# Patient Record
Sex: Female | Born: 1995 | Race: Black or African American | Hispanic: No | Marital: Single | Smoking: Never smoker
Health system: Southern US, Community
[De-identification: ages and names within clinical notes are randomized; demographics above are authoritative.]

## PROBLEM LIST (undated history)

## (undated) DIAGNOSIS — Z789 Other specified health status: Secondary | ICD-10-CM

## (undated) DIAGNOSIS — A549 Gonococcal infection, unspecified: Secondary | ICD-10-CM

## (undated) HISTORY — PX: NO PAST SURGERIES: SHX2092

---
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
## 2017-01-20 ENCOUNTER — Inpatient Hospital Stay (HOSPITAL_COMMUNITY)
Admission: AD | Admit: 2017-01-20 | Discharge: 2017-01-21 | Disposition: A | Discharge status: Home / Self Care | Payer: Medicaid Other | Source: Ambulatory Visit | Attending: Obstetrics & Gynecology | Admitting: Obstetrics & Gynecology

## 2017-01-20 ENCOUNTER — Encounter (HOSPITAL_COMMUNITY): Payer: Self-pay

## 2017-01-20 DIAGNOSIS — R109 Unspecified abdominal pain: Secondary | ICD-10-CM | POA: Insufficient documentation

## 2017-01-20 DIAGNOSIS — Z113 Encounter for screening for infections with a predominantly sexual mode of transmission: Secondary | ICD-10-CM | POA: Insufficient documentation

## 2017-01-20 HISTORY — DX: Other specified health status: Z78.9

## 2017-01-20 LAB — URINALYSIS, ROUTINE W REFLEX MICROSCOPIC
BILIRUBIN URINE: NEGATIVE
GLUCOSE, UA: NEGATIVE mg/dL
HGB URINE DIPSTICK: NEGATIVE
Ketones, ur: NEGATIVE mg/dL
Leukocytes, UA: NEGATIVE
Nitrite: NEGATIVE
Protein, ur: NEGATIVE mg/dL
SPECIFIC GRAVITY, URINE: 1.027 (ref 1.005–1.030)
pH: 7 (ref 5.0–8.0)

## 2017-01-20 LAB — POCT PREGNANCY, URINE: PREG TEST UR: NEGATIVE

## 2017-01-20 NOTE — MAU Note (Signed)
Pt here for STD testing; has some pain on left lower abdominal area.

## 2017-01-21 ENCOUNTER — Encounter (HOSPITAL_COMMUNITY): Payer: Self-pay | Admitting: *Deleted

## 2017-01-21 DIAGNOSIS — Z113 Encounter for screening for infections with a predominantly sexual mode of transmission: Secondary | ICD-10-CM

## 2017-01-21 LAB — RPR: RPR Ser Ql: NONREACTIVE

## 2017-01-21 LAB — WET PREP, GENITAL
Clue Cells Wet Prep HPF POC: NONE SEEN
SPERM: NONE SEEN
Trich, Wet Prep: NONE SEEN
YEAST WET PREP: NONE SEEN

## 2017-01-21 LAB — HIV ANTIBODY (ROUTINE TESTING W REFLEX): HIV SCREEN 4TH GENERATION: NONREACTIVE

## 2017-01-21 NOTE — Discharge Instructions (Signed)
Health Maintenance, Female Adopting a healthy lifestyle and getting preventive care can go a long way to promote health and wellness. Talk with your health care provider about what schedule of regular examinations is right for you. This is a good chance for you to check in with your provider about disease prevention and staying healthy. In between checkups, there are plenty of things you can do on your own. Experts have done a lot of research about which lifestyle changes and preventive measures are most likely to keep you healthy. Ask your health care provider for more information. Weight and diet Eat a healthy diet  Be sure to include plenty of vegetables, fruits, low-fat dairy products, and lean protein.  Do not eat a lot of foods high in solid fats, added sugars, or salt.  Get regular exercise. This is one of the most important things you can do for your health. ? Most adults should exercise for at least 150 minutes each week. The exercise should increase your heart rate and make you sweat (moderate-intensity exercise). ? Most adults should also do strengthening exercises at least twice a week. This is in addition to the moderate-intensity exercise.  Maintain a healthy weight  Body mass index (BMI) is a measurement that can be used to identify possible weight problems. It estimates body fat based on height and weight. Your health care provider can help determine your BMI and help you achieve or maintain a healthy weight.  For females 20 years of age and older: ? A BMI below 18.5 is considered underweight. ? A BMI of 18.5 to 24.9 is normal. ? A BMI of 25 to 29.9 is considered overweight. ? A BMI of 30 and above is considered obese.  Watch levels of cholesterol and blood lipids  You should start having your blood tested for lipids and cholesterol at 20 years of age, then have this test every 5 years.  You may need to have your cholesterol levels checked more often if: ? Your lipid or  cholesterol levels are high. ? You are older than 21 years of age. ? You are at high risk for heart disease.  Cancer screening Lung Cancer  Lung cancer screening is recommended for adults 55-80 years old who are at high risk for lung cancer because of a history of smoking.  A yearly low-dose CT scan of the lungs is recommended for people who: ? Currently smoke. ? Have quit within the past 15 years. ? Have at least a 30-pack-year history of smoking. A pack year is smoking an average of one pack of cigarettes a day for 1 year.  Yearly screening should continue until it has been 15 years since you quit.  Yearly screening should stop if you develop a health problem that would prevent you from having lung cancer treatment.  Breast Cancer  Practice breast self-awareness. This means understanding how your breasts normally appear and feel.  It also means doing regular breast self-exams. Let your health care provider know about any changes, no matter how small.  If you are in your 20s or 30s, you should have a clinical breast exam (CBE) by a health care provider every 1-3 years as part of a regular health exam.  If you are 40 or older, have a CBE every year. Also consider having a breast X-ray (mammogram) every year.  If you have a family history of breast cancer, talk to your health care provider about genetic screening.  If you are at high risk   for breast cancer, talk to your health care provider about having an MRI and a mammogram every year.  Breast cancer gene (BRCA) assessment is recommended for women who have family members with BRCA-related cancers. BRCA-related cancers include: ? Breast. ? Ovarian. ? Tubal. ? Peritoneal cancers.  Results of the assessment will determine the need for genetic counseling and BRCA1 and BRCA2 testing.  Cervical Cancer Your health care provider may recommend that you be screened regularly for cancer of the pelvic organs (ovaries, uterus, and  vagina). This screening involves a pelvic examination, including checking for microscopic changes to the surface of your cervix (Pap test). You may be encouraged to have this screening done every 3 years, beginning at age 21.  For women ages 30-65, health care providers may recommend pelvic exams and Pap testing every 3 years, or they may recommend the Pap and pelvic exam, combined with testing for human papilloma virus (HPV), every 5 years. Some types of HPV increase your risk of cervical cancer. Testing for HPV may also be done on women of any age with unclear Pap test results.  Other health care providers may not recommend any screening for nonpregnant women who are considered low risk for pelvic cancer and who do not have symptoms. Ask your health care provider if a screening pelvic exam is right for you.  If you have had past treatment for cervical cancer or a condition that could lead to cancer, you need Pap tests and screening for cancer for at least 20 years after your treatment. If Pap tests have been discontinued, your risk factors (such as having a new sexual partner) need to be reassessed to determine if screening should resume. Some women have medical problems that increase the chance of getting cervical cancer. In these cases, your health care provider may recommend more frequent screening and Pap tests.  Colorectal Cancer  This type of cancer can be detected and often prevented.  Routine colorectal cancer screening usually begins at 21 years of age and continues through 21 years of age.  Your health care provider may recommend screening at an earlier age if you have risk factors for colon cancer.  Your health care provider may also recommend using home test kits to check for hidden blood in the stool.  A small camera at the end of a tube can be used to examine your colon directly (sigmoidoscopy or colonoscopy). This is done to check for the earliest forms of colorectal  cancer.  Routine screening usually begins at age 50.  Direct examination of the colon should be repeated every 5-10 years through 21 years of age. However, you may need to be screened more often if early forms of precancerous polyps or small growths are found.  Skin Cancer  Check your skin from head to toe regularly.  Tell your health care provider about any new moles or changes in moles, especially if there is a change in a mole's shape or color.  Also tell your health care provider if you have a mole that is larger than the size of a pencil eraser.  Always use sunscreen. Apply sunscreen liberally and repeatedly throughout the day.  Protect yourself by wearing long sleeves, pants, a wide-brimmed hat, and sunglasses whenever you are outside.  Heart disease, diabetes, and high blood pressure  High blood pressure causes heart disease and increases the risk of stroke. High blood pressure is more likely to develop in: ? People who have blood pressure in the high end of   the normal range (130-139/85-89 mm Hg). ? People who are overweight or obese. ? People who are African American.  If you are 79-69 years of age, have your blood pressure checked every 3-5 years. If you are 50 years of age or older, have your blood pressure checked every year. You should have your blood pressure measured twice--once when you are at a hospital or clinic, and once when you are not at a hospital or clinic. Record the average of the two measurements. To check your blood pressure when you are not at a hospital or clinic, you can use: ? An automated blood pressure machine at a pharmacy. ? A home blood pressure monitor.  If you are between 77 years and 74 years old, ask your health care provider if you should take aspirin to prevent strokes.  Have regular diabetes screenings. This involves taking a blood sample to check your fasting blood sugar level. ? If you are at a normal weight and have a low risk for  diabetes, have this test once every three years after 21 years of age. ? If you are overweight and have a high risk for diabetes, consider being tested at a younger age or more often. Preventing infection Hepatitis B  If you have a higher risk for hepatitis B, you should be screened for this virus. You are considered at high risk for hepatitis B if: ? You were born in a country where hepatitis B is common. Ask your health care provider which countries are considered high risk. ? Your parents were born in a high-risk country, and you have not been immunized against hepatitis B (hepatitis B vaccine). ? You have HIV or AIDS. ? You use needles to inject street drugs. ? You live with someone who has hepatitis B. ? You have had sex with someone who has hepatitis B. ? You get hemodialysis treatment. ? You take certain medicines for conditions, including cancer, organ transplantation, and autoimmune conditions.  Hepatitis C  Blood testing is recommended for: ? Everyone born from [birth year] through 1965. ? Anyone with known risk factors for hepatitis C.  Sexually transmitted infections (STIs)  You should be screened for sexually transmitted infections (STIs) including gonorrhea and chlamydia if: ? You are sexually active and are younger than 21 years of age. ? You are older than 21 years of age and your health care provider tells you that you are at risk for this type of infection. ? Your sexual activity has changed since you were last screened and you are at an increased risk for chlamydia or gonorrhea. Ask your health care provider if you are at risk.  If you do not have HIV, but are at risk, it may be recommended that you take a prescription medicine daily to prevent HIV infection. This is called pre-exposure prophylaxis (PrEP). You are considered at risk if: ? You are sexually active and do not regularly use condoms or know the HIV status of your partner(s). ? You take drugs by injection. ? You  are sexually active with a partner who has HIV.  Talk with your health care provider about whether you are at high risk of being infected with HIV. If you choose to begin PrEP, you should first be tested for HIV. You should then be tested every 3 months for as long as you are taking PrEP. Pregnancy  If you are premenopausal and you may become pregnant, ask your health care provider about preconception counseling.  If you may become  pregnant, take 400 to 800 micrograms (mcg) of folic acid every day.  If you want to prevent pregnancy, talk to your health care provider about birth control (contraception). Osteoporosis and menopause  Osteoporosis is a disease in which the bones lose minerals and strength with aging. This can result in serious bone fractures. Your risk for osteoporosis can be identified using a bone density scan.  If you are 20 years of age or older, or if you are at risk for osteoporosis and fractures, ask your health care provider if you should be screened.  Ask your health care provider whether you should take a calcium or vitamin D supplement to lower your risk for osteoporosis.  Menopause may have certain physical symptoms and risks.  Hormone replacement therapy may reduce some of these symptoms and risks. Talk to your health care provider about whether hormone replacement therapy is right for you. Follow these instructions at home:  Schedule regular health, dental, and eye exams.  Stay current with your immunizations.  Do not use any tobacco products including cigarettes, chewing tobacco, or electronic cigarettes.  If you are pregnant, do not drink alcohol.  If you are breastfeeding, limit how much and how often you drink alcohol.  Limit alcohol intake to no more than 1 drink per day for nonpregnant women. One drink equals 12 ounces of beer, 5 ounces of wine, or 1 ounces of hard liquor.  Do not use street drugs.  Do not share needles.  Ask your health care  provider for help if you need support or information about quitting drugs.  Tell your health care provider if you often feel depressed.  Tell your health care provider if you have ever been abused or do not feel safe at home. This information is not intended to replace advice given to you by your health care provider. Make sure you discuss any questions you have with your health care provider. Document Released: 01/15/2011 Document Revised: 12/08/2015 Document Reviewed: 04/05/2015 Elsevier Interactive Patient Education  2018 [employer].   Pregnancy and Sexually Transmitted Infections An STI (sexually transmitted infection) is a disease or infection that may be passed (transmitted) from person to person, usually during sexual activity. This may happen by way of saliva, semen, blood, vaginal mucus, or urine. An STI can be caused by bacteria, viruses, or parasites. During pregnancy, STIs can be dangerous for you and your unborn baby. It is important to take steps to reduce your chances of getting an STI. Also, you need to be seen by your health care provider right away if you think you may have an STI, or if you think you may have been exposed to an STI. Diagnosis and treatment will depend on the type of STI. If you are already pregnant, you will be screened for HIV (human immunodeficiency virus) early in your pregnancy. If you are at high risk for HIV, this test may be repeated during your third trimester of pregnancy. What are some common STIs? There are different types of STIs. Some STIs that cause problems in pregnancy include:  Gonorrhea.  Chlamydia.  Syphilis.  HIV and AIDS (acquired immunodeficiency syndrome).  Genital herpes.  Hepatitis.  Genital warts.  Human papillomavirus (HPV).  Trichomoniasis.  STIs that do not affect the baby include:  Chancroid.  Pubic lice.  What are the possible effects of STIs during pregnancy? STIs can  cause:  Stillbirth.  Miscarriage.  Premature labor.  Premature rupture of the membranes.  Serious birth defects or deformities.  Infection of the amniotic sac.  Infections that occur after birth (postpartum) in you and the baby.  Slowed growth of the baby before birth.  Illnesses in newborns.  What are common symptoms of STIs? Different STIs have different symptoms. Some women may not have any symptoms. If symptoms are present, they may include:  Painful or bloody urination.  Pain in the pelvis, abdomen, vagina, anus, throat, or eyes.  A skin rash, itching, or irritation.  Growths, ulcerations, blisters, or sores in the genital and anal areas.  Fever.  Abnormal vaginal discharge, with or without bad odor.  Pain or bleeding during sexual intercourse.  Yellowing of the skin and the white parts of the eyes (jaundice).  Swollen glands in the groin area.  Even if symptoms are not present, an STI can still be passed to another person during sexual contact. How are STIs diagnosed? Your health care provider can use tests to determine if you have an STI. These may include blood tests, urine tests, and tests performed during a pelvic exam. You should be screened for STIs, including gonorrhea and chlamydia, if:  You are sexually active and are younger than age 78.  You are age 89 or older and your health care provider tells you that you are at risk for these types of infections.  Your sexual activity has changed since the last time you were screened, and you are at an increased risk for chlamydia or gonorrhea. Ask your health care provider if you are at risk.  How can I reduce my risk of getting an STI? Take these actions to reduce your risk of getting an STI:  Do not have any oral, vaginal, or anal sex. This is known as practicing abstinence.  If you have sex, use a latex condom or a female condom consistently and correctly during sexual intercourse.  Use dental dams  and water-soluble lubricants during sexual activity. Do not use petroleum jelly or oils.  Avoid having multiple sexual partners.  Do not have sex with someone who has other sexual partners.  Do not have sex with anyone you do not know or who is at high risk for an STI.  Avoid risky sex acts that can break the skin.  Do not have sex if you have open sores on your mouth or skin.  Avoid engaging in oral and anal sex acts.  Get the hepatitis vaccine. It is safe for pregnant women.  What should I do if I think I have an STI?  See your health care provider.  Tell your sexual partner or partners. They should be tested and treated for any STIs.  Do not have sex until your health care provider says it is okay. Get help right away if: Contact your health care provider right away if:  You have any symptoms of an STI.  You think that you have, or your sexual partner has, an STI even if there are no symptoms.  You think that you may have been exposed to an STI.  This information is not intended to replace advice given to you by your health care provider. Make sure you discuss any questions you have with your health care provider. Document Released: 08/09/2004 Document Revised: 03/01/2016 Document Reviewed: 02/06/2016 Elsevier Interactive Patient Education  [employer].

## 2017-01-21 NOTE — MAU Provider Note (Signed)
History     CSN: [id number]  Arrival date and time: 01/20/17 2258   First Provider Initiated Contact with Patient 01/20/17 2358      Chief Complaint  Patient presents with  . Abdominal Pain    right side   HPI Rebecca Church is a 21 y.o. non pregnant female who presents requesting STD testing. She states "I just want to be checked for everything." She denies any known exposure to STDs. She denies any pain, vaginal bleeding or abnormal discharge. She receives Depo for birth control and has irregular bleeding. LMP sometime last month.   OB History    No data available      Past Medical History:  Diagnosis Date  . Medical history non-contributory     Past Surgical History:  Procedure Laterality Date  . NO PAST SURGERIES      History reviewed. No pertinent family history.  Social History  Substance Use Topics  . Smoking status: Never Smoker  . Smokeless tobacco: Never Used  . Alcohol use No     Comment: occasionally    Allergies: No Known Allergies  Prescriptions Prior to Admission  Medication Sig Dispense Refill Last Dose  . azithromycin (ZITHROMAX) 500 MG tablet Take 2 tablets (1,000 mg total) by mouth daily. 2 tablet 0 More than a month at Unknown time  . cephALEXin (KEFLEX) 500 MG capsule Take 1 capsule (500 mg total) by mouth 3 (three) times daily. 30 capsule 0 More than a month at Unknown time  . fluconazole (DIFLUCAN) 150 MG tablet Take 1 tablet (150 mg total) by mouth once. 1 tablet 0 More than a month at Unknown time  . metroNIDAZOLE (FLAGYL) 500 MG tablet Take 1 tablet (500 mg total) by mouth 2 (two) times daily. 14 tablet 0 More than a month at Unknown time    Review of Systems  Constitutional: Negative.  Negative for chills and fever.  HENT: Negative.   Respiratory: Negative.  Negative for shortness of breath.   Cardiovascular: Negative.  Negative for chest pain.  Gastrointestinal: Negative.  Negative for abdominal pain, constipation, diarrhea, nausea  and vomiting.  Genitourinary: Negative.  Negative for dysuria, vaginal bleeding and vaginal discharge.  Neurological: Negative.  Negative for dizziness and headaches.  Psychiatric/Behavioral: Negative.    Physical Exam   Blood pressure 106/68, pulse 91, temperature 98.7 F (37.1 C), temperature source Oral, height 5\' 11"  (1.803 m), weight 179 lb (81.2 kg), SpO2 99 %.  Physical Exam  Nursing note and vitals reviewed. Constitutional: She appears well-developed and well-nourished.  HENT:  Head: Normocephalic and atraumatic.  Eyes: Conjunctivae are normal. No scleral icterus.  Cardiovascular: Normal rate and normal heart sounds.   Respiratory: Effort normal and breath sounds normal. No respiratory distress.  GI: Soft. She exhibits no distension. There is no tenderness.  Genitourinary: Vagina normal. No vaginal discharge found.  Neurological: She is alert.  Skin: Skin is warm and dry.  Psychiatric: She has a normal mood and affect. Her behavior is normal. Judgment and thought content normal.    MAU Course  Procedures Results for orders placed or performed during the hospital encounter of 01/20/17 (from the past 24 hour(s))  Urinalysis, Routine w reflex microscopic     Status: Abnormal   Collection Time: 01/20/17 11:25 PM  Result Value Ref Range   Color, Urine YELLOW YELLOW   APPearance HAZY (A) CLEAR   Specific Gravity, Urine 1.027 1.005 - 1.030   pH 7.0 5.0 - 8.0   Glucose, UA  NEGATIVE NEGATIVE mg/dL   Hgb urine dipstick NEGATIVE NEGATIVE   Bilirubin Urine NEGATIVE NEGATIVE   Ketones, ur NEGATIVE NEGATIVE mg/dL   Protein, ur NEGATIVE NEGATIVE mg/dL   Nitrite NEGATIVE NEGATIVE   Leukocytes, UA NEGATIVE NEGATIVE  Pregnancy, urine POC     Status: None   Collection Time: 01/20/17 11:36 PM  Result Value Ref Range   Preg Test, Ur NEGATIVE NEGATIVE  Wet prep, genital     Status: Abnormal   Collection Time: 01/21/17 12:01 AM  Result Value Ref Range   Yeast Wet Prep HPF POC NONE  SEEN NONE SEEN   Trich, Wet Prep NONE SEEN NONE SEEN   Clue Cells Wet Prep HPF POC NONE SEEN NONE SEEN   WBC, Wet Prep HPF POC FEW (A) NONE SEEN   Sperm NONE SEEN    MDM UA, UPT Wet prep, gc/chlamydia HIV, RPR Assessment and Plan   1. Screening for STD (sexually transmitted disease)    -Discharge patient home in stable condition -[facility] or [facility] of choice for routine screenings -Encouraged to return here or to other Urgent Care/ED if she develops worsening of symptoms, increase in pain, fever, or other concerning symptoms.   Cleone SlimCaroline Neill SN[facility] 01/21/2017, 12:28 AM   I confirm that I have verified the information documented in the nurse midwife student's note and that I have also personally reperformed the physical exam and all medical decision making activities.   Thressa ShellerHeather Korban Shearer 2:18 AM 01/21/17

## 2017-01-22 LAB — GC/CHLAMYDIA PROBE AMP (~~LOC~~) NOT AT ARMC
Chlamydia: NEGATIVE
NEISSERIA GONORRHEA: NEGATIVE

## 2017-05-20 ENCOUNTER — Telehealth: Payer: Self-pay | Admitting: General Practice

## 2017-05-20 NOTE — Telephone Encounter (Signed)
Patient called and left message stating she is calling about her referral. Called patient back and she states that she was referred from [facility] for abnormal pap. Reviewed office records & nothing has been sent. Advised patient that we do not have her records and provided our fax number. Told patient to call [facility] and have them send records again then our front office staff will call her with an appt once we have those records. Patient verbalized understanding & had no questions

## 2017-05-28 ENCOUNTER — Encounter (HOSPITAL_COMMUNITY): Payer: Self-pay

## 2017-06-24 ENCOUNTER — Ambulatory Visit: Payer: Self-pay | Admitting: Obstetrics & Gynecology

## 2017-07-02 ENCOUNTER — Ambulatory Visit (HOSPITAL_COMMUNITY): Payer: Self-pay

## 2017-07-10 ENCOUNTER — Ambulatory Visit: Payer: Self-pay | Admitting: Obstetrics & Gynecology

## 2017-08-20 ENCOUNTER — Ambulatory Visit (HOSPITAL_COMMUNITY)
Admission: RE | Admit: 2017-08-20 | Discharge: 2017-08-20 | Disposition: A | Discharge status: Home / Self Care | Payer: Self-pay | Source: Ambulatory Visit | Attending: Obstetrics and Gynecology | Admitting: Obstetrics and Gynecology

## 2017-08-20 ENCOUNTER — Encounter (HOSPITAL_COMMUNITY): Payer: Self-pay | Admitting: *Deleted

## 2017-08-20 VITALS — BP 102/64 | Ht 71.0 in | Wt 186.0 lb

## 2017-08-20 DIAGNOSIS — R87612 Low grade squamous intraepithelial lesion on cytologic smear of cervix (LGSIL): Secondary | ICD-10-CM

## 2017-08-20 DIAGNOSIS — Z01419 Encounter for gynecological examination (general) (routine) without abnormal findings: Secondary | ICD-10-CM

## 2017-08-20 NOTE — Patient Instructions (Signed)
Explained breast self awareness with Rebecca Church. Told patient that follow-up will be based on the result of today's Pap smear. Let patient know will follow up with her within the next couple weeks with results of Pap smear by letter or phone. Rebecca Skillikki Vanvorst verbalized understanding.  Lorette Peterkin, Kathaleen Maserhristine Poll, RN 4:00 PM

## 2017-08-20 NOTE — Progress Notes (Signed)
Pap smear completed

## 2017-08-20 NOTE — Progress Notes (Signed)
[facility] due to having an abnormal Pap smear 02/12/2017 and recommending a colposcopy for follow-up.  Pap Smear: Pap smear completed today. Last Pap smear was 02/12/2017 at [facility] and [facility]. Due to being over 426-months since abnormal Pap smear a repeat Pap smear was recommended for follow-up. Per patient her most recent Pap smear is the only a abnormal Pap smear she has had. Last Pap smear result is in Epic.   Physical exam: Breasts Breasts symmetrical. No skin abnormalities bilateral breasts. No nipple retraction bilateral breasts. No nipple discharge bilateral breasts. No lymphadenopathy. No lumps palpated bilateral breasts. No complaints of pain or tenderness on exam. Screening mammogram recommended at age 22 unless clinically indicated prior.  Pelvic/Bimanual   Ext Genitalia No lesions, no swelling and no discharge observed on external genitalia.         Vagina Vagina pink and normal texture. No lesions or discharge observed in vagina.          Cervix Cervix is present. Cervix pink and of normal texture. No discharge observed.     Uterus Uterus is present and palpable. Uterus in normal position and normal size.        Adnexae Bilateral ovaries present and palpable. No tenderness on palpation.          Rectovaginal No rectal exam completed today since patient had no rectal complaints. No skin abnormalities observed on exam.    Smoking History: Patient has never smoked.  Patient Navigation: Patient education provided. Access to services provided for patient through [facility] program.

## 2017-08-21 ENCOUNTER — Encounter (HOSPITAL_COMMUNITY): Payer: Self-pay | Admitting: *Deleted

## 2017-08-22 ENCOUNTER — Ambulatory Visit: Payer: Medicaid Other | Admitting: *Deleted

## 2017-08-22 ENCOUNTER — Other Ambulatory Visit (HOSPITAL_COMMUNITY)
Admission: RE | Admit: 2017-08-22 | Discharge: 2017-08-22 | Disposition: A | Discharge status: Home / Self Care | Payer: BLUE CROSS/BLUE SHIELD | Source: Ambulatory Visit | Attending: Student | Admitting: Student

## 2017-08-22 DIAGNOSIS — Z113 Encounter for screening for infections with a predominantly sexual mode of transmission: Secondary | ICD-10-CM

## 2017-08-22 LAB — CYTOLOGY - PAP: HPV: DETECTED — AB

## 2017-08-22 NOTE — Progress Notes (Signed)
Pt came in today for STD test. Pt stated having no symptoms and will call pt for lab result

## 2017-08-23 LAB — GC/CHLAMYDIA PROBE AMP (~~LOC~~) NOT AT ARMC
CHLAMYDIA, DNA PROBE: NEGATIVE
NEISSERIA GONORRHEA: NEGATIVE

## 2017-08-28 ENCOUNTER — Ambulatory Visit: Payer: Medicaid Other | Admitting: Obstetrics and Gynecology

## 2017-08-29 ENCOUNTER — Telehealth: Payer: Self-pay | Admitting: Family Medicine

## 2017-08-29 NOTE — Telephone Encounter (Signed)
Calling regarding her test results

## 2017-09-03 ENCOUNTER — Encounter: Payer: Self-pay | Admitting: *Deleted

## 2017-09-03 NOTE — Telephone Encounter (Signed)
Response sent via MyChart

## 2017-09-09 ENCOUNTER — Telehealth (HOSPITAL_COMMUNITY): Payer: Self-pay | Admitting: *Deleted

## 2017-09-09 NOTE — Telephone Encounter (Signed)
Telephoned patient at home number and advised patient of abnormal pap smear results. Ad[facility] 1:55 pm. Patient voiced understanding.

## 2017-10-07 ENCOUNTER — Ambulatory Visit (INDEPENDENT_AMBULATORY_CARE_PROVIDER_SITE_OTHER): Payer: BLUE CROSS/BLUE SHIELD | Admitting: Family Medicine

## 2017-10-07 ENCOUNTER — Other Ambulatory Visit (HOSPITAL_COMMUNITY)
Admission: RE | Admit: 2017-10-07 | Discharge: 2017-10-07 | Disposition: A | Discharge status: Home / Self Care | Payer: BLUE CROSS/BLUE SHIELD | Source: Ambulatory Visit | Attending: Obstetrics and Gynecology | Admitting: Obstetrics and Gynecology

## 2017-10-07 ENCOUNTER — Encounter: Payer: Self-pay | Admitting: *Deleted

## 2017-10-07 ENCOUNTER — Encounter: Payer: Self-pay | Admitting: Family Medicine

## 2017-10-07 ENCOUNTER — Other Ambulatory Visit (HOSPITAL_COMMUNITY)
Admission: RE | Admit: 2017-10-07 | Discharge: 2017-10-07 | Disposition: A | Discharge status: Home / Self Care | Payer: BLUE CROSS/BLUE SHIELD | Source: Ambulatory Visit | Attending: Family Medicine | Admitting: Family Medicine

## 2017-10-07 VITALS — BP 109/66 | HR 92 | Ht 71.0 in | Wt 188.6 lb

## 2017-10-07 DIAGNOSIS — N898 Other specified noninflammatory disorders of vagina: Secondary | ICD-10-CM

## 2017-10-07 DIAGNOSIS — R87612 Low grade squamous intraepithelial lesion on cytologic smear of cervix (LGSIL): Secondary | ICD-10-CM | POA: Diagnosis present

## 2017-10-07 DIAGNOSIS — N760 Acute vaginitis: Secondary | ICD-10-CM | POA: Insufficient documentation

## 2017-10-07 DIAGNOSIS — B373 Candidiasis of vulva and vagina: Secondary | ICD-10-CM | POA: Insufficient documentation

## 2017-10-07 DIAGNOSIS — B9689 Other specified bacterial agents as the cause of diseases classified elsewhere: Secondary | ICD-10-CM | POA: Diagnosis not present

## 2017-10-07 NOTE — Progress Notes (Signed)
Patient Name: Rebecca Church, female   DOB: 1996/06/17, 22 y.o.  MRN: [id number]  Colposcopy Procedure Note:  G0P0000 Pregnancy status: Unknown Indications: LSIL HPV:  Positive Cervical History:  Previous Abnormal Pap: LSIL 02/12/17  Previous Colposcopy: none  Previous LEEP or Cryo: none  Smoking: Never Smoked Hysterectomy: No   Patient given informed consent, signed copy in the chart, time out was performed.    Exam: Vulva and Vagina grossly normal.  Cervix viewed with speculum and colposcope after application of acetic acid:  Cervix Fully Visualized Squamocolumnar Junction Visibility: Fully visualized  Acetowhite lesions: 5 and 12 o'clock  Other Lesions: None Punctation: Fine  Mosaicism: Not present Abnormal vasculature: No   Biopsies: 5 and 12 o'clock ECC: Yes - Curette and Brush  Hemostasis achieved with:  Monsel's Solution  Colposcopy Impression:  CIN1   Patient was given post procedure instructions.  Will call patient with results.

## 2017-10-08 LAB — CERVICOVAGINAL ANCILLARY ONLY
Bacterial vaginitis: POSITIVE — AB
CHLAMYDIA, DNA PROBE: NEGATIVE
Candida vaginitis: POSITIVE — AB
Neisseria Gonorrhea: NEGATIVE
Trichomonas: NEGATIVE

## 2017-10-09 ENCOUNTER — Encounter: Payer: Self-pay | Admitting: Family Medicine

## 2017-10-09 ENCOUNTER — Other Ambulatory Visit: Payer: Self-pay | Admitting: Family Medicine

## 2017-10-09 MED ORDER — FLUCONAZOLE 150 MG PO TABS: 150.0000 mg | ORAL_TABLET | Freq: Once | ORAL | 0 refills | Status: AC

## 2017-10-09 MED ORDER — METRONIDAZOLE 500 MG PO TABS: 500.0000 mg | ORAL_TABLET | Freq: Two times a day (BID) | ORAL | 0 refills | Status: DC

## 2017-10-14 ENCOUNTER — Telehealth: Payer: Self-pay | Admitting: General Practice

## 2017-10-14 NOTE — Telephone Encounter (Signed)
Patient called and left message on voicemail stating she has a question about a prescription. Called patient and she states she doesn't have a question anymore, she figured it out. Patient had no questions

## 2017-12-20 ENCOUNTER — Other Ambulatory Visit: Payer: Self-pay | Admitting: Family Medicine

## 2017-12-20 ENCOUNTER — Encounter: Payer: Self-pay | Admitting: Family Medicine

## 2017-12-20 ENCOUNTER — Other Ambulatory Visit: Payer: Self-pay | Admitting: General Practice

## 2017-12-20 DIAGNOSIS — B379 Candidiasis, unspecified: Secondary | ICD-10-CM

## 2017-12-20 MED ORDER — FLUCONAZOLE 150 MG PO TABS: 150.0000 mg | ORAL_TABLET | Freq: Once | ORAL | 0 refills | Status: AC

## 2017-12-23 ENCOUNTER — Encounter: Payer: Self-pay | Admitting: Advanced Practice Midwife

## 2017-12-23 ENCOUNTER — Ambulatory Visit (INDEPENDENT_AMBULATORY_CARE_PROVIDER_SITE_OTHER): Payer: BLUE CROSS/BLUE SHIELD | Admitting: Advanced Practice Midwife

## 2017-12-23 VITALS — BP 102/74 | HR 86 | Resp 16 | Wt 185.2 lb

## 2017-12-23 DIAGNOSIS — B373 Candidiasis of vulva and vagina: Secondary | ICD-10-CM

## 2017-12-23 DIAGNOSIS — R102 Pelvic and perineal pain: Secondary | ICD-10-CM

## 2017-12-23 DIAGNOSIS — N760 Acute vaginitis: Secondary | ICD-10-CM | POA: Insufficient documentation

## 2017-12-23 DIAGNOSIS — B9689 Other specified bacterial agents as the cause of diseases classified elsewhere: Secondary | ICD-10-CM | POA: Diagnosis not present

## 2017-12-23 DIAGNOSIS — Z202 Contact with and (suspected) exposure to infections with a predominantly sexual mode of transmission: Secondary | ICD-10-CM

## 2017-12-23 DIAGNOSIS — B3731 Acute candidiasis of vulva and vagina: Secondary | ICD-10-CM | POA: Insufficient documentation

## 2017-12-23 NOTE — Patient Instructions (Signed)

## 2017-12-23 NOTE — Progress Notes (Signed)
GYNECOLOGY ANNUAL PREVENTATIVE CARE ENCOUNTER NOTE  Subjective:   Rebecca Church is a 22 y.o. G0P0000 female here for a routine annual gynecologic exam.  Current complaints: new onset external vaginal pain "for a little while".  States she called Peds clinic to receive new prescription for Flagyl and was told, based on her symptoms, she [facility]. Denies abnormal vaginal bleeding, discharge, pelvic pain, problems with intercourse or other gynecologic concerns.    Reports she is not sexually active right now, last intercourse within the past two weeks. Condoms not used.  Has pubic hair waxed off. States she was last waxed Thursday 12/19/17. Reports she tried new salon for first time. Uses hypoallergenic cloths instead of toilet paper.   Gynecologic History Patient's last menstrual period was 12/19/2017 (approximate). States she was still spotting this morning Contraception: none Last Pap: 08/20/2017. Results were: abnormal LSIL Last mammogram:N/A age 22.  Obstetric History OB History  Gravida Para Term Preterm AB Living  0 0 0 0 0 0  SAB TAB Ectopic Multiple Live Births  0 0 0 0 0    Past Medical History:  Diagnosis Date  . Medical history non-contributory     Past Surgical History:  Procedure Laterality Date  . NO PAST SURGERIES      No current outpatient medications on file prior to visit.   No current facility-administered medications on file prior to visit.     No Known Allergies  Social History   Socioeconomic History  . Marital status: Single    Spouse name: Not on file  . Number of children: Not on file  . Years of education: Not on file  . Highest education level: Not on file  Occupational History  . Not on file  Social Needs  . Financial resource strain: Not on file  . Food insecurity:    Worry: Not on file    Inability: Not on file  . Transportation needs:    Medical: Not on file    Non-medical: Not on file  Tobacco Use    . Smoking status: Never Smoker  . Smokeless tobacco: Never Used  Substance and Sexual Activity  . Alcohol use: No    Comment: occasionally  . Drug use: No  . Sexual activity: Yes    Birth control/protection: Condom  Lifestyle  . Physical activity:    Days per week: Not on file    Minutes per session: Not on file  . Stress: Not on file  Relationships  . Social connections:    Talks on phone: Patient refused    Gets together: Patient refused    Attends religious service: Patient refused    Active member of club or organization: Patient refused    Attends meetings of clubs or organizations: Patient refused    Relationship status: Patient refused  . Intimate partner violence:    Fear of current or ex partner: Patient refused    Emotionally abused: Patient refused    Physically abused: Patient refused    Forced sexual activity: Patient refused  Other Topics Concern  . Not on file  Social History Narrative  . Not on file    No family history on file.  The following portions of the patient's history were reviewed and updated as appropriate: allergies, current medications, past family history, past medical history, past social history, past surgical history and problem list.  Review of Systems Pertinent items noted in HPI and remainder of comprehensive ROS otherwise  negative.   Objective:  BP 102/74 (BP Location: Left Arm, Patient Position: Sitting, Cuff Size: Large)   Pulse 86   Resp 16   Wt 185 lb 3.2 oz (84 kg)   LMP 12/19/2017 (Approximate)   BMI 25.83 kg/m  CONSTITUTIONAL: Well-developed, well-nourished female in no acute distress.  HENT:  Normocephalic, atraumatic, External right and left ear normal. Oropharynx is clear and moist EYES: Conjunctivae and EOM are normal. Pupils are equal, round, and reactive to light. No scleral icterus.  NECK: Normal range of motion, supple, no masses.  Normal thyroid.  SKIN: Skin is warm and dry. No rash noted. Not diaphoretic. No  erythema. No pallor. MUSCULOSKELETAL: Normal range of motion. No tenderness.  No cyanosis, clubbing, or edema.  2+ distal pulses. NEUROLOGIC: Alert and oriented to person, place, and time. Normal reflexes, muscle tone coordination. No cranial nerve deficit noted. PSYCHIATRIC: Normal mood and affect. Normal behavior. Normal judgment and thought content. CARDIOVASCULAR: Normal heart rate noted, regular rhythm RESPIRATORY: Clear to auscultation bilaterally. Effort and breath sounds normal, no problems with respiration noted. BREASTS: Symmetric in size. No masses, skin changes, nipple drainage, or lymphadenopathy. ABDOMEN: Soft, normal bowel sounds, no distention noted.  No tenderness, rebound or guarding.  PELVIC: Normal appearing external genitalia; normal appearing vaginal mucosa and cervix.  No abnormal discharge noted.  Pap smear obtained.  Normal uterine size, no other palpable masses, no uterine or adnexal tenderness. Superficial irritation visible along growth pattern of pubic hair  Assessment and Plan:  1. Exposure to sexually transmitted disease (STD) - Hepatitis B surface antibody - Hepatitis C Antibody - HIV antibody (with reflex) - RPR - Declines GCC, states she was tested and was negative two weeks ago  2. Vaginal pain - Urine Culture - Recommend hiatus from waxing pubic hair - Reviewed hygiene for use of perfumes, dyes, irritants - Advised patient to reduce current use of hypoallergenic wipes, gently blot with toilet paper instead - Discussed probiotic supplement to minimize recurrence of BV - Routine preventative health maintenance measures emphasized. - Please refer to After Visit Summary for other counseling recommendations.  - PAP: 08/20/2017 - Colposcopy 10/07/2017   Clayton Bibles, CNM 12/23/17  4:29 PM

## 2017-12-24 ENCOUNTER — Other Ambulatory Visit (HOSPITAL_COMMUNITY)
Admission: RE | Admit: 2017-12-24 | Discharge: 2017-12-24 | Disposition: A | Discharge status: Home / Self Care | Payer: BLUE CROSS/BLUE SHIELD | Source: Ambulatory Visit | Attending: Advanced Practice Midwife | Admitting: Advanced Practice Midwife

## 2017-12-24 DIAGNOSIS — N760 Acute vaginitis: Secondary | ICD-10-CM | POA: Diagnosis not present

## 2017-12-24 DIAGNOSIS — B9689 Other specified bacterial agents as the cause of diseases classified elsewhere: Secondary | ICD-10-CM | POA: Insufficient documentation

## 2017-12-24 DIAGNOSIS — B373 Candidiasis of vulva and vagina: Secondary | ICD-10-CM | POA: Insufficient documentation

## 2017-12-24 DIAGNOSIS — Z202 Contact with and (suspected) exposure to infections with a predominantly sexual mode of transmission: Secondary | ICD-10-CM | POA: Diagnosis present

## 2017-12-24 DIAGNOSIS — R102 Pelvic and perineal pain: Secondary | ICD-10-CM | POA: Insufficient documentation

## 2017-12-24 LAB — HIV ANTIBODY (ROUTINE TESTING W REFLEX): HIV SCREEN 4TH GENERATION: NONREACTIVE

## 2017-12-24 LAB — RPR: RPR Ser Ql: NONREACTIVE

## 2017-12-24 LAB — HEPATITIS C ANTIBODY: Hep C Virus Ab: <0.1 s/co ratio (ref 0.0–0.9)

## 2017-12-24 LAB — HEPATITIS B SURFACE ANTIBODY, QUANTITATIVE: Hepatitis B Surf Ab Quant: <3.1 m[IU]/mL — ABNORMAL LOW (ref >9.9)

## 2017-12-24 NOTE — Addendum Note (Signed)
Addended by: Judd GaudierLEVENS, Myrka Sylva M on: 12/24/2017 09:56 AM   Modules accepted: Orders

## 2017-12-25 LAB — URINE CULTURE

## 2017-12-26 ENCOUNTER — Other Ambulatory Visit: Payer: Self-pay | Admitting: Advanced Practice Midwife

## 2017-12-26 ENCOUNTER — Telehealth: Payer: Self-pay | Admitting: Family Medicine

## 2017-12-26 LAB — CERVICOVAGINAL ANCILLARY ONLY
BACTERIAL VAGINITIS: POSITIVE — AB
CANDIDA VAGINITIS: NEGATIVE
CHLAMYDIA, DNA PROBE: NEGATIVE
Neisseria Gonorrhea: NEGATIVE
Trichomonas: NEGATIVE

## 2017-12-26 MED ORDER — METRONIDAZOLE 500 MG PO TABS: 500.0000 mg | ORAL_TABLET | Freq: Two times a day (BID) | ORAL | 0 refills | Status: DC

## 2017-12-26 NOTE — Telephone Encounter (Signed)
Requesting call back. She has questions about her chart.

## 2017-12-26 NOTE — Progress Notes (Unsigned)
Urine culture positive for BV. Flagyl 500 mg PO BID x 7 days called to patient pharmacy on record.  Staff message sent to notify patient that prescription is waiting for pickup.   Clayton BiblesSamantha Weinhold, CNM 12/26/17  10:20 AM

## 2017-12-27 ENCOUNTER — Telehealth: Payer: Self-pay

## 2017-12-27 ENCOUNTER — Encounter: Payer: Self-pay | Admitting: Family Medicine

## 2017-12-27 NOTE — Telephone Encounter (Signed)
Rebecca GublerPam Church spoke with patient regarding results.

## 2017-12-27 NOTE — Telephone Encounter (Signed)
Called pt to advise of test results and to advise [facility] [place] Pt also stated that she did not understand why results are not showing in My Chart. Advised until Dr. Vicente Sereneeleases it does not show until a couple of days. Pt verbalized understanding.

## 2018-02-10 ENCOUNTER — Ambulatory Visit: Payer: BLUE CROSS/BLUE SHIELD | Admitting: *Deleted

## 2018-02-10 ENCOUNTER — Other Ambulatory Visit (HOSPITAL_COMMUNITY)
Admission: RE | Admit: 2018-02-10 | Discharge: 2018-02-10 | Disposition: A | Discharge status: Home / Self Care | Payer: BLUE CROSS/BLUE SHIELD | Source: Ambulatory Visit | Attending: Obstetrics & Gynecology | Admitting: Obstetrics & Gynecology

## 2018-02-10 DIAGNOSIS — Z113 Encounter for screening for infections with a predominantly sexual mode of transmission: Secondary | ICD-10-CM

## 2018-02-10 NOTE — Progress Notes (Signed)
Chart reviewed for nurse visit. Agree with plan of care.   Judeth HornLawrence, Yuridiana Formanek, NP 02/10/2018 4:39 PM

## 2018-02-10 NOTE — Progress Notes (Signed)
Pt tested positive for BV 12/24/2017.Wants to be sure is gone. Sometimes does not have symptoms per pt. Currently denies any vag d/c and no symptoms of BV. Would also like STI testing but not HIV. WAs negative for STIs 12/24/2017. Pt self swabbed and will ck for results on Mychart in few days.

## 2018-02-12 ENCOUNTER — Other Ambulatory Visit: Payer: Self-pay | Admitting: Student

## 2018-02-12 DIAGNOSIS — B9689 Other specified bacterial agents as the cause of diseases classified elsewhere: Secondary | ICD-10-CM

## 2018-02-12 DIAGNOSIS — N760 Acute vaginitis: Principal | ICD-10-CM

## 2018-02-12 LAB — CERVICOVAGINAL ANCILLARY ONLY
BACTERIAL VAGINITIS: POSITIVE — AB
CANDIDA VAGINITIS: NEGATIVE
Chlamydia: NEGATIVE
Neisseria Gonorrhea: NEGATIVE
Trichomonas: NEGATIVE

## 2018-02-12 MED ORDER — METRONIDAZOLE 500 MG PO TABS: 500.0000 mg | ORAL_TABLET | Freq: Two times a day (BID) | ORAL | 0 refills | Status: DC

## 2018-04-01 ENCOUNTER — Other Ambulatory Visit (HOSPITAL_COMMUNITY): Payer: Self-pay | Admitting: Family

## 2018-04-01 DIAGNOSIS — N760 Acute vaginitis: Principal | ICD-10-CM

## 2018-04-01 DIAGNOSIS — B9689 Other specified bacterial agents as the cause of diseases classified elsewhere: Secondary | ICD-10-CM

## 2018-04-02 ENCOUNTER — Inpatient Hospital Stay (HOSPITAL_COMMUNITY)
Admission: AD | Admit: 2018-04-02 | Discharge: 2018-04-03 | Disposition: A | Discharge status: Home / Self Care | Payer: BLUE CROSS/BLUE SHIELD | Source: Ambulatory Visit | Attending: Obstetrics and Gynecology | Admitting: Obstetrics and Gynecology

## 2018-04-02 DIAGNOSIS — Z5321 Procedure and treatment not carried out due to patient leaving prior to being seen by health care provider: Secondary | ICD-10-CM | POA: Insufficient documentation

## 2018-04-02 DIAGNOSIS — N898 Other specified noninflammatory disorders of vagina: Secondary | ICD-10-CM | POA: Insufficient documentation

## 2018-04-02 LAB — URINALYSIS, ROUTINE W REFLEX MICROSCOPIC
BILIRUBIN URINE: NEGATIVE
Glucose, UA: NEGATIVE mg/dL
Ketones, ur: NEGATIVE mg/dL
Nitrite: NEGATIVE
PH: 6 (ref 5.0–8.0)
PROTEIN: NEGATIVE mg/dL
Specific Gravity, Urine: 1.006 (ref 1.005–1.030)

## 2018-04-02 LAB — WET PREP, GENITAL
Clue Cells Wet Prep HPF POC: NONE SEEN
SPERM: NONE SEEN
Trich, Wet Prep: NONE SEEN
YEAST WET PREP: NONE SEEN

## 2018-04-02 LAB — POCT PREGNANCY, URINE: Preg Test, Ur: NEGATIVE

## 2018-04-02 NOTE — MAU Note (Signed)
Pt here for vaginal discharge that itches, but denies odor. States she has taken Flagyl in the past but thinks "my body is immune to it." Was seen 2 weeks ago for this but the medication is not helping. Denies vaginal bleeding or pain. LMP: 03/30/2018

## 2018-04-03 NOTE — MAU Note (Signed)
NOT IN LOBBY 

## 2018-04-03 NOTE — MAU Note (Signed)
Not in lobby

## 2018-04-04 LAB — GC/CHLAMYDIA PROBE AMP (~~LOC~~) NOT AT ARMC
Chlamydia: NEGATIVE
Neisseria Gonorrhea: NEGATIVE

## 2018-04-05 ENCOUNTER — Other Ambulatory Visit (HOSPITAL_COMMUNITY): Payer: Self-pay | Admitting: Family

## 2018-04-05 DIAGNOSIS — N760 Acute vaginitis: Principal | ICD-10-CM

## 2018-04-05 DIAGNOSIS — B9689 Other specified bacterial agents as the cause of diseases classified elsewhere: Secondary | ICD-10-CM

## 2018-04-09 ENCOUNTER — Other Ambulatory Visit (HOSPITAL_COMMUNITY): Payer: Self-pay | Admitting: Family

## 2018-04-09 DIAGNOSIS — N760 Acute vaginitis: Principal | ICD-10-CM

## 2018-04-09 DIAGNOSIS — B9689 Other specified bacterial agents as the cause of diseases classified elsewhere: Secondary | ICD-10-CM

## 2018-04-13 ENCOUNTER — Other Ambulatory Visit (HOSPITAL_COMMUNITY): Payer: Self-pay | Admitting: Family

## 2018-04-13 DIAGNOSIS — B9689 Other specified bacterial agents as the cause of diseases classified elsewhere: Secondary | ICD-10-CM

## 2018-04-13 DIAGNOSIS — N760 Acute vaginitis: Principal | ICD-10-CM

## 2018-06-06 ENCOUNTER — Other Ambulatory Visit: Payer: Self-pay | Admitting: Advanced Practice Midwife

## 2018-06-06 ENCOUNTER — Encounter (HOSPITAL_COMMUNITY): Payer: Self-pay | Admitting: *Deleted

## 2018-06-06 ENCOUNTER — Inpatient Hospital Stay (HOSPITAL_COMMUNITY)
Admission: AD | Admit: 2018-06-06 | Discharge: 2018-06-06 | Disposition: A | Discharge status: Home / Self Care | Payer: BLUE CROSS/BLUE SHIELD | Source: Ambulatory Visit | Attending: Obstetrics & Gynecology | Admitting: Obstetrics & Gynecology

## 2018-06-06 ENCOUNTER — Inpatient Hospital Stay (HOSPITAL_COMMUNITY): Payer: BLUE CROSS/BLUE SHIELD

## 2018-06-06 DIAGNOSIS — O3680X0 Pregnancy with inconclusive fetal viability, not applicable or unspecified: Secondary | ICD-10-CM | POA: Insufficient documentation

## 2018-06-06 DIAGNOSIS — Z3A11 11 weeks gestation of pregnancy: Secondary | ICD-10-CM | POA: Diagnosis not present

## 2018-06-06 DIAGNOSIS — O039 Complete or unspecified spontaneous abortion without complication: Secondary | ICD-10-CM

## 2018-06-06 DIAGNOSIS — O4691 Antepartum hemorrhage, unspecified, first trimester: Secondary | ICD-10-CM | POA: Insufficient documentation

## 2018-06-06 DIAGNOSIS — O209 Hemorrhage in early pregnancy, unspecified: Secondary | ICD-10-CM

## 2018-06-06 DIAGNOSIS — Z3201 Encounter for pregnancy test, result positive: Secondary | ICD-10-CM

## 2018-06-06 LAB — COMPREHENSIVE METABOLIC PANEL
ALT: 15 U/L (ref 0–44)
AST: 20 U/L (ref 15–41)
Albumin: 4 g/dL (ref 3.5–5.0)
Alkaline Phosphatase: 48 U/L (ref 38–126)
Anion gap: 8 (ref 5–15)
BUN: 6 mg/dL (ref 6–20)
CO2: 27 mmol/L (ref 22–32)
Calcium: 9.2 mg/dL (ref 8.9–10.3)
Chloride: 103 mmol/L (ref 98–111)
Creatinine, Ser: 0.78 mg/dL (ref 0.44–1.00)
GFR calc Af Amer: >60 mL/min (ref >60)
GFR calc non Af Amer: >60 mL/min (ref >60)
Glucose, Bld: 98 mg/dL (ref 70–99)
Potassium: 3.6 mmol/L (ref 3.5–5.1)
Sodium: 138 mmol/L (ref 135–145)
Total Bilirubin: 0.8 mg/dL (ref 0.3–1.2)
Total Protein: 7.1 g/dL (ref 6.5–8.1)

## 2018-06-06 LAB — CBC WITH DIFFERENTIAL/PLATELET
Basophils Absolute: 0 10*3/uL (ref 0.0–0.1)
Basophils Relative: 0 %
Eosinophils Absolute: 0.4 10*3/uL (ref 0.0–0.5)
Eosinophils Relative: 5 %
HCT: 41.5 % (ref 36.0–46.0)
Hemoglobin: 13.7 g/dL (ref 12.0–15.0)
Lymphocytes Relative: 34 %
Lymphs Abs: 2.9 10*3/uL (ref 0.7–4.0)
MCH: 30.7 pg (ref 26.0–34.0)
MCHC: 33 g/dL (ref 30.0–36.0)
MCV: 93 fL (ref 80.0–100.0)
Monocytes Absolute: 0.4 10*3/uL (ref 0.1–1.0)
Monocytes Relative: 4 %
Neutro Abs: 4.9 10*3/uL (ref 1.7–7.7)
Neutrophils Relative %: 57 %
Platelets: 277 10*3/uL (ref 150–400)
RBC: 4.46 MIL/uL (ref 3.87–5.11)
RDW: 12.5 % (ref 11.5–15.5)
WBC: 8.6 10*3/uL (ref 4.0–10.5)
nRBC: 0 % (ref 0.0–0.2)

## 2018-06-06 LAB — HCG, QUANTITATIVE, PREGNANCY: hCG, Beta Chain, Quant, S: 8458 m[IU]/mL — ABNORMAL HIGH (ref <5)

## 2018-06-06 LAB — TYPE AND SCREEN
ABO/RH(D): O POS
Antibody Screen: NEGATIVE

## 2018-06-06 LAB — URINALYSIS, ROUTINE W REFLEX MICROSCOPIC

## 2018-06-06 LAB — URINALYSIS, MICROSCOPIC (REFLEX): RBC / HPF: >50 RBC/hpf (ref 0–5)

## 2018-06-06 LAB — ABO/RH: ABO/RH(D): O POS

## 2018-06-06 LAB — POCT PREGNANCY, URINE: PREG TEST UR: POSITIVE — AB

## 2018-06-06 NOTE — MAU Note (Signed)
Pt stated she has irregular periods. Has not had a period for 2 months and started bleeding  2 days ago.  Had mild cramping at first but got stronger last night. [facility] and passed a large blood clot. Also c/o N/V vomited several times last night and once today.

## 2018-06-06 NOTE — MAU Provider Note (Signed)
History     CSN: [id number]  Arrival date and time: 06/06/18 1441   First Provider Initiated Contact with Patient 06/06/18 1455      Chief Complaint  Patient presents with  . Emesis  . Vaginal Bleeding   HPI Rebecca Church is a 22 y.o. G1P0000 at [redacted]w[redacted]d by LMP who presents to MAU with chief complaint of heavy vaginal bleeding in the setting of LMP 03/18/18. This is a new problem, onset today. Patient states she passed a large clot at work. Denies SOB, dizziness, weakness, syncope.  OB History    Gravida  1   Para  0   Term  0   Preterm  0   AB  0   Living  0     SAB  0   TAB  0   Ectopic  0   Multiple  0   Live Births  0           Past Medical History:  Diagnosis Date  . Medical history non-contributory     Past Surgical History:  Procedure Laterality Date  . NO PAST SURGERIES      No family history on file.  Social History   Tobacco Use  . Smoking status: Never Smoker  . Smokeless tobacco: Never Used  Substance Use Topics  . Alcohol use: No    Comment: occasionally  . Drug use: No    Allergies: No Known Allergies  Medications Prior to Admission  Medication Sig Dispense Refill Last Dose  . metroNIDAZOLE (FLAGYL) 500 MG tablet Take 1 tablet (500 mg total) by mouth 2 (two) times daily. 14 tablet 0     Review of Systems  Constitutional: Negative for chills, fatigue and fever.  Gastrointestinal: Negative for abdominal pain.       Patient denies cramping upon CNM's initial assessment and throughout time in MAU  Genitourinary: Positive for vaginal bleeding. Negative for difficulty urinating.  Neurological: Negative for syncope, weakness and headaches.  All other systems reviewed and are negative.       Physical Exam   Blood pressure 114/71, pulse 90, temperature 98.8 F (37.1 C), resp. rate 18, height 5\' 11"  (1.803 m), weight 85.7 kg, last menstrual period 06/04/2018.  Physical Exam  Nursing note and vitals  reviewed. Constitutional: She is oriented to person, place, and time. She appears well-developed and well-nourished.  Cardiovascular: Normal rate.  Respiratory: Effort normal.  GI: Soft. She exhibits no distension. There is no tenderness. There is no rebound and no guarding.  Genitourinary:  Genitourinary Comments: Scant bleeding on pad during evaluation in MAU  Musculoskeletal: Normal range of motion.  Neurological: She is alert and oriented to person, place, and time.    MAU Course  Procedures  MDM Patient Vitals for the past 24 hrs:  BP Temp Pulse Resp Height Weight  06/06/18 1504 114/71 98.8 F (37.1 C) 90 18 5\' 11"  (1.803 m) 85.7 kg    Results for orders placed or performed during the hospital encounter of 06/06/18 (from the past 24 hour(s))  Urinalysis, Routine w reflex microscopic     Status: Abnormal   Collection Time: 06/06/18  3:12 PM  Result Value Ref Range   Color, Urine RED (A) YELLOW   APPearance TURBID (A) CLEAR   Specific Gravity, Urine  1.005 - 1.030    TEST NOT REPORTED DUE TO COLOR INTERFERENCE OF URINE PIGMENT   pH  5.0 - 8.0    TEST NOT REPORTED DUE TO COLOR INTERFERENCE OF  URINE PIGMENT   Glucose, UA (A) NEGATIVE mg/dL    TEST NOT REPORTED DUE TO COLOR INTERFERENCE OF URINE PIGMENT   Hgb urine dipstick (A) NEGATIVE    TEST NOT REPORTED DUE TO COLOR INTERFERENCE OF URINE PIGMENT   Bilirubin Urine (A) NEGATIVE    TEST NOT REPORTED DUE TO COLOR INTERFERENCE OF URINE PIGMENT   Ketones, ur (A) NEGATIVE mg/dL    TEST NOT REPORTED DUE TO COLOR INTERFERENCE OF URINE PIGMENT   Protein, ur (A) NEGATIVE mg/dL    TEST NOT REPORTED DUE TO COLOR INTERFERENCE OF URINE PIGMENT   Nitrite (A) NEGATIVE    TEST NOT REPORTED DUE TO COLOR INTERFERENCE OF URINE PIGMENT   Leukocytes, UA (A) NEGATIVE    TEST NOT REPORTED DUE TO COLOR INTERFERENCE OF URINE PIGMENT  Urinalysis, Microscopic (reflex)     Status: Abnormal   Collection Time: 06/06/18  3:12 PM  Result Value Ref  Range   RBC / HPF >50 0 - 5 RBC/hpf   WBC, UA 0-5 0 - 5 WBC/hpf   Bacteria, UA RARE (A) NONE SEEN   Squamous Epithelial / LPF 0-5 0 - 5  Pregnancy, urine POC     Status: Abnormal   Collection Time: 06/06/18  3:16 PM  Result Value Ref Range   Preg Test, Ur POSITIVE (A) NEGATIVE  CBC with Differential/Platelet     Status: None   Collection Time: 06/06/18  4:04 PM  Result Value Ref Range   WBC 8.6 4.0 - 10.5 K/uL   RBC 4.46 3.87 - 5.11 MIL/uL   Hemoglobin 13.7 12.0 - 15.0 g/dL   HCT 96.0 45.4 - 09.8 %   MCV 93.0 80.0 - 100.0 fL   MCH 30.7 26.0 - 34.0 pg   MCHC 33.0 30.0 - 36.0 g/dL   RDW 11.9 14.7 - 82.9 %   Platelets 277 150 - 400 K/uL   nRBC 0.0 0.0 - 0.2 %   Neutrophils Relative % 57 %   Neutro Abs 4.9 1.7 - 7.7 K/uL   Lymphocytes Relative 34 %   Lymphs Abs 2.9 0.7 - 4.0 K/uL   Monocytes Relative 4 %   Monocytes Absolute 0.4 0.1 - 1.0 K/uL   Eosinophils Relative 5 %   Eosinophils Absolute 0.4 0.0 - 0.5 K/uL   Basophils Relative 0 %   Basophils Absolute 0.0 0.0 - 0.1 K/uL  Type and screen [facility]     Status: None   Collection Time: 06/06/18  4:04 PM  Result Value Ref Range   ABO/RH(D) O POS    Antibody Screen NEG    Sample Expiration      06/09/2018 Performed at [facility], [address], [place], [place] [address]   Comprehensive metabolic panel     Status: None   Collection Time: 06/06/18  4:04 PM  Result Value Ref Range   Sodium 138 135 - 145 mmol/L   Potassium 3.6 3.5 - 5.1 mmol/L   Chloride 103 98 - 111 mmol/L   CO2 27 22 - 32 mmol/L   Glucose, Bld 98 70 - 99 mg/dL   BUN 6 6 - 20 mg/dL   Creatinine, Ser 0.86 0.44 - 1.00 mg/dL   Calcium 9.2 8.9 - 57.8 mg/dL   Total Protein 7.1 6.5 - 8.1 g/dL   Albumin 4.0 3.5 - 5.0 g/dL   AST 20 15 - 41 U/L   ALT 15 0 - 44 U/L   Alkaline Phosphatase 48 38 - 126  U/L   Total Bilirubin 0.8 0.3 - 1.2 mg/dL   GFR calc non Af Amer >60 >60 mL/min   GFR calc Af Amer >60 >60 mL/min   Anion gap 8 5 -  15  hCG, quantitative, pregnancy     Status: Abnormal   Collection Time: 06/06/18  4:04 PM  Result Value Ref Range   hCG, Beta Chain, Quant, S 8,458 (H) <5 mIU/mL    [place] Ob Less Than 14 Weeks With Ob Transvaginal  Result Date: 06/06/2018 CLINICAL DATA:  First trimester vaginal bleeding. Beta hCG 8,458. Gestational age by last menstrual period 11 weeks and 3 days. EXAM: OBSTETRIC <14 WK US AND TRANSVAGINAL OB US TECHNIQUE: Both transabdominal and transvaginal ultrasound examinations were performed for complete evaluation of the gestation as well as the maternal uterus, adnexal regions, and pelvic cul-de-sac. Transvaginal technique was performed to assess early pregnancy. COMPARISON:  None. FINDINGS: Intrauterine gestational sac: Not present. Minimal free fluid in lower endometrial cavity. Yolk sac:  Not present. Embryo:  No present. Cardiac Activity: Not applicable. Subchorionic hemorrhage:  No free fluid. Maternal uterus/adnexae: Normal appearance of the uterus. RIGHT ovary is 2.8 x 1.4 x 2.2 cm, normal in appearance. LEFT ovary is 2.7 x 1.5 x 1.5 cm, normal in appearance. IMPRESSION: Pregnancy of unknown anatomic location (no intrauterine gestational sac or adnexal mass identified). Differential diagnosis includes recent spontaneous miscarriage, IUP too early to visualize, and non-visualized ectopic pregnancy. Recommend correlation with serial beta-hCG levels, and follow up US if warranted clinically. Electronically Signed   By: Awilda Metroourtnay  Bloomer M.D.   On: 06/06/2018 17:50   Assessment and Plan  --22 y.o. G1P0000 at 4313w3d with likely complete abortion --O POS, Rhogam not indicated --Discharge home in stable condition, bleeding and ectopic precautions reviewed  F/U: Patient to return to MAU Sunday 06/08/18 for repeat Quant hCG  Calvert CantorSamantha C , CNM 06/06/2018, 5:55 PM

## 2018-06-06 NOTE — Discharge Instructions (Signed)

## 2018-06-08 ENCOUNTER — Inpatient Hospital Stay (HOSPITAL_COMMUNITY)
Admission: AD | Admit: 2018-06-08 | Discharge: 2018-06-08 | Disposition: A | Discharge status: Home / Self Care | Payer: BLUE CROSS/BLUE SHIELD | Source: Ambulatory Visit | Attending: Obstetrics & Gynecology | Admitting: Obstetrics & Gynecology

## 2018-06-08 DIAGNOSIS — O039 Complete or unspecified spontaneous abortion without complication: Secondary | ICD-10-CM | POA: Diagnosis not present

## 2018-06-08 DIAGNOSIS — O034 Incomplete spontaneous abortion without complication: Secondary | ICD-10-CM

## 2018-06-08 LAB — HCG, QUANTITATIVE, PREGNANCY: hCG, Beta Chain, Quant, S: 2495 m[IU]/mL — ABNORMAL HIGH (ref <5)

## 2018-06-08 NOTE — Discharge Instructions (Signed)

## 2018-06-08 NOTE — MAU Provider Note (Signed)
Ms. Rebecca Church  is a 22 y.o. G1P0000 at 5946w5d who [facility] for follow-up quant hCG after 48 hours.   RN Note: Here for follow up HCG Having light bleeding but states it is consistent with the bleeding she has been having No pain  The patient was seen in MAU on 06/06/18  quant hCG of 8458  US showed No gestational sac.  Patient had passed what looked like products of conception.  She denies pain Endorses light vaginal bleeding  Denies fever    OB History  Gravida Para Term Preterm AB Living  1 0 0 0 0 0  SAB TAB Ectopic Multiple Live Births  0 0 0 0 0    # Outcome Date GA Lbr Len/2nd Weight Sex Delivery Anes PTL Lv  1 Current             Past Medical History:  Diagnosis Date  . Medical history non-contributory      LMP 03/18/2018 (Within Weeks)   CONSTITUTIONAL: Well-developed, well-nourished female in no acute distress.  MUSCULOSKELETAL: Normal range of motion.  CARDIOVASCULAR: Regular heart rate RESPIRATORY: Normal effort NEUROLOGICAL: Alert and oriented to person, place, and time.  SKIN: Skin is warm and dry. No rash noted. Not diaphoretic. No erythema. No pallor. PSYCH: Normal mood and affect. Normal behavior. Normal judgment and thought content.  Results for orders placed or performed during the hospital encounter of 06/08/18 (from the past 24 hour(s))  hCG, quantitative, pregnancy     Status: Abnormal   Collection Time: 06/08/18  1:32 PM  Result Value Ref Range   hCG, Beta Chain, Quant, S 2,495 (H) <5 mIU/mL    Ref. Range 06/06/2018 16:04  HCG, Beta Chain, Quant, S Latest Ref Range: <5 mIU/mL 8,458 (H)   A: Fall in Quantitative HCG over 48 hours Likely complete Spontaneous Abortion  P: Discharge home Discussed this confirms likely complete SAB Patient will return to clinic in a week for follow-up HCG in clinic (message sent)  Patient may return to MAU as needed or if her condition were to change or worsen    Aviva SignsWilliams, Marie L, CNM 06/08/2018 1:13 PM

## 2018-06-08 NOTE — MAU Note (Signed)
Here for follow up HCG  Having light bleeding but states it is consistent with the bleeding she has been having  No pain

## 2018-06-20 ENCOUNTER — Other Ambulatory Visit: Payer: BLUE CROSS/BLUE SHIELD

## 2018-06-20 ENCOUNTER — Other Ambulatory Visit: Payer: Self-pay | Admitting: General Practice

## 2018-06-20 DIAGNOSIS — O039 Complete or unspecified spontaneous abortion without complication: Secondary | ICD-10-CM

## 2018-06-25 ENCOUNTER — Other Ambulatory Visit (HOSPITAL_COMMUNITY)
Admission: RE | Admit: 2018-06-25 | Discharge: 2018-06-25 | Disposition: A | Discharge status: Home / Self Care | Payer: BLUE CROSS/BLUE SHIELD | Source: Ambulatory Visit | Attending: Obstetrics & Gynecology | Admitting: Obstetrics & Gynecology

## 2018-06-25 ENCOUNTER — Other Ambulatory Visit (INDEPENDENT_AMBULATORY_CARE_PROVIDER_SITE_OTHER): Payer: BLUE CROSS/BLUE SHIELD

## 2018-06-25 DIAGNOSIS — O039 Complete or unspecified spontaneous abortion without complication: Secondary | ICD-10-CM

## 2018-06-25 DIAGNOSIS — N898 Other specified noninflammatory disorders of vagina: Secondary | ICD-10-CM | POA: Diagnosis present

## 2018-06-25 NOTE — Progress Notes (Signed)
Pt here for lab only c/o vaginal discharge that has an odor.  Pt advised on how to obtain self swab and we will call her if there is anything abnormal.  Pt stated understanding with no further questions.

## 2018-06-25 NOTE — Progress Notes (Signed)
I have reviewed this chart and agree with the RN/CMA assessment and management.    K. Meryl Jalaysha Skilton, M.D. Attending [facility] (Faculty Practice)   

## 2018-06-26 LAB — BETA HCG QUANT (REF LAB): HCG QUANT: 13 m[IU]/mL

## 2018-06-27 LAB — CERVICOVAGINAL ANCILLARY ONLY
Bacterial vaginitis: POSITIVE — AB
CHLAMYDIA, DNA PROBE: NEGATIVE
Candida vaginitis: NEGATIVE
Neisseria Gonorrhea: NEGATIVE
Trichomonas: NEGATIVE

## 2018-06-30 ENCOUNTER — Other Ambulatory Visit: Payer: Self-pay | Admitting: Obstetrics and Gynecology

## 2018-06-30 MED ORDER — METRONIDAZOLE 500 MG PO TABS: 500.0000 mg | ORAL_TABLET | Freq: Two times a day (BID) | ORAL | 0 refills | Status: DC

## 2018-06-30 NOTE — Progress Notes (Signed)
[facility]yl [facility] to [facility] for BV.

## 2018-08-27 ENCOUNTER — Other Ambulatory Visit (HOSPITAL_COMMUNITY)
Admission: RE | Admit: 2018-08-27 | Discharge: 2018-08-27 | Disposition: A | Discharge status: Home / Self Care | Payer: BLUE CROSS/BLUE SHIELD | Source: Ambulatory Visit | Attending: Medical | Admitting: Medical

## 2018-08-27 ENCOUNTER — Ambulatory Visit (INDEPENDENT_AMBULATORY_CARE_PROVIDER_SITE_OTHER): Payer: BLUE CROSS/BLUE SHIELD

## 2018-08-27 DIAGNOSIS — Z113 Encounter for screening for infections with a predominantly sexual mode of transmission: Secondary | ICD-10-CM | POA: Diagnosis not present

## 2018-08-29 LAB — CERVICOVAGINAL ANCILLARY ONLY
Bacterial vaginitis: POSITIVE — AB
Candida vaginitis: NEGATIVE
Chlamydia: POSITIVE — AB
NEISSERIA GONORRHEA: NEGATIVE
TRICH (WINDOWPATH): NEGATIVE

## 2018-08-29 NOTE — Progress Notes (Signed)
Pt here today for STD screening.  Pt explained how to obtain self swab.  Pt informed that we will call her with abnormal results.  Pt stated understanding with no further questions.

## 2018-09-01 ENCOUNTER — Other Ambulatory Visit: Payer: Self-pay | Admitting: Student

## 2018-09-01 ENCOUNTER — Encounter: Payer: Self-pay | Admitting: *Deleted

## 2018-09-01 DIAGNOSIS — B9689 Other specified bacterial agents as the cause of diseases classified elsewhere: Secondary | ICD-10-CM

## 2018-09-01 DIAGNOSIS — N760 Acute vaginitis: Secondary | ICD-10-CM

## 2018-09-01 DIAGNOSIS — A749 Chlamydial infection, unspecified: Secondary | ICD-10-CM

## 2018-09-01 MED ORDER — AZITHROMYCIN 500 MG PO TABS: 1000.0000 mg | ORAL_TABLET | Freq: Once | ORAL | 0 refills | Status: AC

## 2018-09-01 MED ORDER — METRONIDAZOLE 500 MG PO TABS: 500.0000 mg | ORAL_TABLET | Freq: Two times a day (BID) | ORAL | 0 refills | Status: DC

## 2018-09-01 NOTE — Progress Notes (Signed)
Chart reviewed for nurse visit. Agree with plan of care.   Judeth Horn, NP 09/01/2018 8:10 AM

## 2018-09-01 NOTE — Progress Notes (Signed)
Per infectious disease report noted pt. Tested positive for chlamydia. Patient has been notified by provider by MyChart. Communicable disease report completed and faxed to  [facility].

## 2018-09-10 ENCOUNTER — Ambulatory Visit: Payer: BLUE CROSS/BLUE SHIELD | Admitting: Obstetrics & Gynecology

## 2018-09-29 ENCOUNTER — Ambulatory Visit (INDEPENDENT_AMBULATORY_CARE_PROVIDER_SITE_OTHER): Payer: BLUE CROSS/BLUE SHIELD | Admitting: *Deleted

## 2018-09-29 ENCOUNTER — Other Ambulatory Visit (HOSPITAL_COMMUNITY)
Admission: RE | Admit: 2018-09-29 | Discharge: 2018-09-29 | Disposition: A | Discharge status: Home / Self Care | Payer: BLUE CROSS/BLUE SHIELD | Source: Ambulatory Visit | Attending: Family Medicine | Admitting: Family Medicine

## 2018-09-29 ENCOUNTER — Other Ambulatory Visit: Payer: Self-pay

## 2018-09-29 VITALS — Temp 98.7°F

## 2018-09-29 DIAGNOSIS — B9689 Other specified bacterial agents as the cause of diseases classified elsewhere: Secondary | ICD-10-CM | POA: Diagnosis present

## 2018-09-29 DIAGNOSIS — N760 Acute vaginitis: Secondary | ICD-10-CM | POA: Insufficient documentation

## 2018-09-29 DIAGNOSIS — Z202 Contact with and (suspected) exposure to infections with a predominantly sexual mode of transmission: Secondary | ICD-10-CM | POA: Insufficient documentation

## 2018-09-29 NOTE — Progress Notes (Signed)
Here for std screening .States was tested positive for chlamydia and bv and wants to get tested to see if it is gone now. Wants to do self swab for gc, trich, yeast , bv.

## 2018-10-01 LAB — CERVICOVAGINAL ANCILLARY ONLY
BACTERIAL VAGINITIS: NEGATIVE
Candida vaginitis: NEGATIVE
Chlamydia: NEGATIVE
Neisseria Gonorrhea: NEGATIVE
TRICH (WINDOWPATH): NEGATIVE

## 2018-10-02 ENCOUNTER — Encounter: Payer: Self-pay | Admitting: Obstetrics & Gynecology

## 2018-10-06 ENCOUNTER — Telehealth: Payer: Self-pay | Admitting: Obstetrics & Gynecology

## 2018-10-06 NOTE — Telephone Encounter (Signed)
I called Rebecca Church and informed her that her wet prep from 09/29/18 was all negative ( for BV, yeast, GC, trich) and that it would either be released by provider or auto realeased eventually. She voices understanding.

## 2018-10-06 NOTE — Telephone Encounter (Signed)
The patient called in about results. Stated no one has reached out to her and her Rebecca Church has not updated. Would like a call back [phone].

## 2019-01-13 ENCOUNTER — Other Ambulatory Visit: Payer: Self-pay

## 2019-01-13 ENCOUNTER — Ambulatory Visit (HOSPITAL_COMMUNITY)
Admission: EM | Admit: 2019-01-13 | Discharge: 2019-01-13 | Disposition: A | Discharge status: Home / Self Care | Payer: BC Managed Care – PPO | Attending: Urgent Care | Admitting: Urgent Care

## 2019-01-13 ENCOUNTER — Encounter (HOSPITAL_COMMUNITY): Payer: Self-pay | Admitting: Urgent Care

## 2019-01-13 DIAGNOSIS — R1032 Left lower quadrant pain: Secondary | ICD-10-CM | POA: Diagnosis not present

## 2019-01-13 DIAGNOSIS — R1030 Lower abdominal pain, unspecified: Secondary | ICD-10-CM | POA: Insufficient documentation

## 2019-01-13 DIAGNOSIS — R195 Other fecal abnormalities: Secondary | ICD-10-CM | POA: Diagnosis not present

## 2019-01-13 DIAGNOSIS — R509 Fever, unspecified: Secondary | ICD-10-CM | POA: Diagnosis present

## 2019-01-13 DIAGNOSIS — Z3202 Encounter for pregnancy test, result negative: Secondary | ICD-10-CM

## 2019-01-13 LAB — POCT PREGNANCY, URINE: Preg Test, Ur: NEGATIVE

## 2019-01-13 LAB — POCT URINALYSIS DIP (DEVICE)
Bilirubin Urine: NEGATIVE
Glucose, UA: NEGATIVE mg/dL
Ketones, ur: NEGATIVE mg/dL
Leukocytes,Ua: NEGATIVE
Nitrite: NEGATIVE
Protein, ur: NEGATIVE mg/dL
Specific Gravity, Urine: 1.015 (ref 1.005–1.030)
Urobilinogen, UA: 1 mg/dL (ref 0.0–1.0)
pH: 7.5 (ref 5.0–8.0)

## 2019-01-13 MED ORDER — AZITHROMYCIN 250 MG PO TABS
ORAL_TABLET | ORAL | Status: AC
  Filled 2019-01-13: qty 4

## 2019-01-13 MED ORDER — CEFTRIAXONE SODIUM 250 MG IJ SOLR
250.0000 mg | Freq: Once | INTRAMUSCULAR | Status: AC
  Administered 2019-01-13: 250 mg via INTRAMUSCULAR

## 2019-01-13 MED ORDER — AZITHROMYCIN 250 MG PO TABS
1000.0000 mg | ORAL_TABLET | Freq: Once | ORAL | Status: AC
  Administered 2019-01-13: 1000 mg via ORAL

## 2019-01-13 MED ORDER — ACETAMINOPHEN 325 MG PO TABS
ORAL_TABLET | ORAL | Status: AC
  Filled 2019-01-13: qty 3

## 2019-01-13 MED ORDER — ACETAMINOPHEN 500 MG PO TABS
1000.0000 mg | ORAL_TABLET | Freq: Once | ORAL | Status: AC
  Administered 2019-01-13: 1000 mg via ORAL

## 2019-01-13 MED ORDER — CEFTRIAXONE SODIUM 250 MG IJ SOLR
INTRAMUSCULAR | Status: AC
  Filled 2019-01-13: qty 250

## 2019-01-13 NOTE — Discharge Instructions (Signed)
You may take 500mg Tylenol with ibuprofen 400-600mg every 6 hours for pain and inflammation. °

## 2019-01-13 NOTE — ED Provider Notes (Addendum)
MRN: [id number] DOB: 1996-02-03  Subjective:   Rebecca Church is a 23 y.o. female presenting for 4 day history of acute onset persistent sharp intermittent llq pain. Sx aggravated when she would urinate or defecate.  However, she has noted slight improvement in her pain. Has had diarrhea/loose stools, 1-2 bowel movements per day. Has tried ibuprofen with some relief. LMP was 12/17/2018 but has had intermittent light bleeding.  Patient is sexually active with one female partner, tries to use condoms consistently.  Of note, patient did have a miscarriage November 2019.  Patient is not currently taking any medications, denies past medical and surgical history.  No Known Allergies  Past Medical History:  Diagnosis Date  . Medical history non-contributory      Past Surgical History:  Procedure Laterality Date  . NO PAST SURGERIES      Review of Systems  Constitutional: Positive for malaise/fatigue. Negative for fever.  HENT: Negative for ear pain, sinus pain and sore throat.   Eyes: Negative for blurred vision and double vision.  Respiratory: Negative for cough, hemoptysis, shortness of breath and wheezing.   Cardiovascular: Negative for chest pain.  Gastrointestinal: Positive for abdominal pain and diarrhea. Negative for nausea and vomiting.  Genitourinary: Positive for hematuria. Negative for dysuria and flank pain.  Musculoskeletal: Negative for myalgias.  Skin: Negative for rash.  Neurological: Positive for weakness. Negative for headaches.  Psychiatric/Behavioral: Negative for depression and substance abuse.    Objective:   Vitals: BP (!) 90/50 (BP Location: Left Arm) Comment: reported BP to Nurse Kim Lapan-Hutchens  Pulse (!) 101 Comment: reported HR to Nurse Kim Lapan-Hutchens  Temp (!) 101 F (38.3 C) (Oral) Comment: Reported temperature to Nurse Kim lapan-Hutchens  Resp 16   LMP 03/18/2018 (Within Weeks)   SpO2 100%   BP was 103/68 on recheck by PA Quashon Jesus at  10:18.  Physical Exam Constitutional:      General: She is not in acute distress.    Appearance: Normal appearance. She is well-developed and normal weight. She is not ill-appearing, toxic-appearing or diaphoretic.  HENT:     Head: Normocephalic and atraumatic.     Right Ear: External ear normal.     Left Ear: External ear normal.     Nose: Nose normal.     Mouth/Throat:     Mouth: Mucous membranes are moist.     Pharynx: Oropharynx is clear.  Eyes:     General: No scleral icterus.    Extraocular Movements: Extraocular movements intact.     Pupils: Pupils are equal, round, and reactive to light.  Cardiovascular:     Rate and Rhythm: Normal rate and regular rhythm.     Pulses: Normal pulses.     Heart sounds: Normal heart sounds. No murmur. No friction rub. No gallop.   Pulmonary:     Effort: Pulmonary effort is normal. No respiratory distress.     Breath sounds: Normal breath sounds. No stridor. No wheezing, rhonchi or rales.  Abdominal:     General: Bowel sounds are normal. There is no distension.     Palpations: Abdomen is soft. There is no mass.     Tenderness: There is abdominal tenderness (LLQ > RLQ pain) in the right lower quadrant and left lower quadrant. There is no right CVA tenderness, left CVA tenderness, guarding or rebound. Negative signs include Murphy's sign and McBurney's sign.  Genitourinary:    Comments: Patient declined exam as we are doing testing and empiric tx. Skin:  General: Skin is warm and dry.     Coloration: Skin is not pale.     Findings: No rash.  Neurological:     General: No focal deficit present.     Mental Status: She is alert and oriented to person, place, and time.  Psychiatric:        Mood and Affect: Mood normal.        Behavior: Behavior normal.        Thought Content: Thought content normal.        Judgment: Judgment normal.     Results for orders placed or performed during the hospital encounter of 01/13/19 (from the past 24  hour(s))  POCT urinalysis dip (device)     Status: Abnormal   Collection Time: 01/13/19  9:57 AM  Result Value Ref Range   Glucose, UA NEGATIVE NEGATIVE mg/dL   Bilirubin Urine NEGATIVE NEGATIVE   Ketones, ur NEGATIVE NEGATIVE mg/dL   Specific Gravity, Urine 1.015 1.005 - 1.030   Hgb urine dipstick MODERATE (A) NEGATIVE   pH 7.5 5.0 - 8.0   Protein, ur NEGATIVE NEGATIVE mg/dL   Urobilinogen, UA 1.0 0.0 - 1.0 mg/dL   Nitrite NEGATIVE NEGATIVE   Leukocytes,Ua NEGATIVE NEGATIVE  Pregnancy, urine POC     Status: None   Collection Time: 01/13/19 10:09 AM  Result Value Ref Range   Preg Test, Ur NEGATIVE NEGATIVE    Assessment and Plan :   1. Abdominal pain, left lower quadrant   2. Fever, unspecified   3. Loose stools   4. Lower abdominal pain    Counseled patient on differential which includes STI, UTI, PID has most likely possibilities.  We both agreed to treat this empirically as an STI with IM ceftriaxone, azithromycin in clinic.  Labs pending.  Strict ER and rtc precautions. Counseled patient on potential for adverse effects with medications prescribed today, patient verbalized understanding.    Jaynee Eagles, PA-C 01/13/19 1025    [address] PA-C 01/13/19 1026    Jaynee Eagles, [place] 01/13/19 1026

## 2019-01-14 LAB — CERVICOVAGINAL ANCILLARY ONLY
Bacterial vaginitis: POSITIVE — AB
Candida vaginitis: NEGATIVE
Chlamydia: NEGATIVE
Neisseria Gonorrhea: POSITIVE — AB
Trichomonas: NEGATIVE

## 2019-01-14 LAB — URINE CULTURE: Culture: NO GROWTH

## 2019-01-14 LAB — HIV ANTIBODY (ROUTINE TESTING W REFLEX): HIV Screen 4th Generation wRfx: NONREACTIVE

## 2019-01-14 LAB — RPR: RPR Ser Ql: NONREACTIVE

## 2019-01-15 ENCOUNTER — Telehealth (HOSPITAL_COMMUNITY): Payer: Self-pay | Admitting: Emergency Medicine

## 2019-01-15 MED ORDER — METRONIDAZOLE 500 MG PO TABS: 500.0000 mg | ORAL_TABLET | Freq: Two times a day (BID) | ORAL | 0 refills | Status: AC

## 2019-01-15 NOTE — Telephone Encounter (Signed)
Bacterial vaginosis is positive. This was not treated at the urgent care visit.  Flagyl 500 mg BID x 7 days #14 no refills sent to patients pharmacy of choice.    Test for gonorrhea was positive. This was treated at the urgent care visit with IM rocephin 250mg  and po zithromax 1g. Pt needs education to refrain from sexual intercourse for 7 days after treatment to give the medicine time to work. Sexual partners need to be notified and tested/treated. Condoms may reduce risk of reinfection. Recheck or followup with PCP for further evaluation if symptoms are not improving. [facility] notified.   Patient contacted and made aware of all results, all questions answered.

## 2019-01-20 ENCOUNTER — Encounter: Payer: Self-pay | Admitting: *Deleted

## 2019-02-02 ENCOUNTER — Ambulatory Visit (INDEPENDENT_AMBULATORY_CARE_PROVIDER_SITE_OTHER): Payer: BC Managed Care – PPO | Admitting: Obstetrics & Gynecology

## 2019-02-02 ENCOUNTER — Encounter: Payer: Self-pay | Admitting: Obstetrics & Gynecology

## 2019-02-02 ENCOUNTER — Other Ambulatory Visit: Payer: Self-pay

## 2019-02-02 VITALS — BP 107/74 | HR 83 | Wt 182.6 lb

## 2019-02-02 DIAGNOSIS — A549 Gonococcal infection, unspecified: Secondary | ICD-10-CM

## 2019-02-02 DIAGNOSIS — N898 Other specified noninflammatory disorders of vagina: Secondary | ICD-10-CM | POA: Diagnosis not present

## 2019-02-02 DIAGNOSIS — Z113 Encounter for screening for infections with a predominantly sexual mode of transmission: Secondary | ICD-10-CM | POA: Diagnosis not present

## 2019-02-02 DIAGNOSIS — Z124 Encounter for screening for malignant neoplasm of cervix: Secondary | ICD-10-CM | POA: Diagnosis not present

## 2019-02-02 DIAGNOSIS — N760 Acute vaginitis: Secondary | ICD-10-CM

## 2019-02-02 DIAGNOSIS — B373 Candidiasis of vulva and vagina: Secondary | ICD-10-CM | POA: Diagnosis not present

## 2019-02-02 DIAGNOSIS — B9689 Other specified bacterial agents as the cause of diseases classified elsewhere: Secondary | ICD-10-CM | POA: Diagnosis not present

## 2019-02-02 DIAGNOSIS — R87612 Low grade squamous intraepithelial lesion on cytologic smear of cervix (LGSIL): Secondary | ICD-10-CM | POA: Diagnosis not present

## 2019-02-02 DIAGNOSIS — Z1151 Encounter for screening for human papillomavirus (HPV): Secondary | ICD-10-CM | POA: Diagnosis not present

## 2019-02-02 HISTORY — DX: Gonococcal infection, unspecified: A54.9

## 2019-02-02 NOTE — Patient Instructions (Signed)
Return to clinic for any scheduled appointments or for any gynecologic concerns as needed.   

## 2019-02-02 NOTE — Progress Notes (Signed)
   GYNECOLOGY OFFICE VISIT NOTE  History:   Rebecca Church is a 23 y.o. G0P0000 here today for evaluation of vulvar irritation for a few days.  Recently treated for gonorrhea two weeks ago.  Was needing lubrication for intercourse and used a green apple favored lubricant. Had irritation afterwards around the outside, but it is getting better now. Also noted some thin white discharge that is malodorous . She denies any abnormal vaginal bleeding, pelvic pain or other concerns.    Past Medical History:  Diagnosis Date  . Gonorrhea 02/02/2019    Past Surgical History:  Procedure Laterality Date  . NO PAST SURGERIES      The following portions of the patient's history were reviewed and updated as appropriate: allergies, current medications, past family history, past medical history, past social history, past surgical history and problem list.   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  Review of Systems:  Pertinent items noted in HPI and remainder of comprehensive ROS otherwise negative.  Physical Exam:  BP 107/74   Pulse 83   Wt 182 lb 9.6 oz (82.8 kg)   BMI 25.47 kg/m  CONSTITUTIONAL: Well-developed, well-nourished female in no acute distress.  HEENT:  Normocephalic, atraumatic. External right and left ear normal. No scleral icterus.  NECK: Normal range of motion, supple, no masses noted on observation SKIN: No rash noted. Not diaphoretic. No erythema. No pallor. MUSCULOSKELETAL: Normal range of motion. No edema noted. NEUROLOGIC: Alert and oriented to person, place, and time. Normal muscle tone coordination. No cranial nerve deficit noted. PSYCHIATRIC: Normal mood and affect. Normal behavior. Normal judgment and thought content. CARDIOVASCULAR: Normal heart rate noted RESPIRATORY: Effort and breath sounds normal, no problems with respiration noted ABDOMEN: No masses noted. No other overt distention noted.   PELVIC: Normal  appearing external genitalia with no erythema noted; normal appearing vaginal mucosa and cervix.  Thin white  discharge noted.  Normal uterine size, no other palpable masses, no uterine or adnexal tenderness.      Assessment and Plan:    1. Vulvovaginitis Proper vulvar hygiene emphasized: discussed avoidance of perfumed soaps, detergents, lotions and any type of douches; in addition to wearing cotton underwear and no underwear at night.  Also recommended cleaning front to back, voiding and cleaning up after intercourse. Will check discharge, high likelihood of still being positive given recent GC infection.  - Cervicovaginal ancillary only  2. LGSIL on Pap smear of cervix - Cytology - PAP done today, will follow up results and manage accordingly  Routine preventative health maintenance measures emphasized. Please refer to After Visit Summary for other counseling recommendations.   Return in about 4 weeks (around 03/02/2019) for RN visit for repeat swab.    Total face-to-face time with patient: 15 minutes.  Over 50% of encounter was spent on counseling and coordination of care.   Verita Schneiders, MD, [place] for [employer], [place]

## 2019-02-05 ENCOUNTER — Other Ambulatory Visit: Payer: Self-pay | Admitting: General Practice

## 2019-02-05 ENCOUNTER — Telehealth: Payer: Self-pay | Admitting: Family Medicine

## 2019-02-05 DIAGNOSIS — B379 Candidiasis, unspecified: Secondary | ICD-10-CM

## 2019-02-05 DIAGNOSIS — B9689 Other specified bacterial agents as the cause of diseases classified elsewhere: Secondary | ICD-10-CM

## 2019-02-05 DIAGNOSIS — B49 Unspecified mycosis: Secondary | ICD-10-CM

## 2019-02-05 LAB — CYTOLOGY - PAP
Diagnosis: NEGATIVE
HPV: NOT DETECTED

## 2019-02-05 LAB — CERVICOVAGINAL ANCILLARY ONLY
Bacterial vaginitis: POSITIVE — AB
Candida vaginitis: POSITIVE — AB
Chlamydia: NEGATIVE
Neisseria Gonorrhea: NEGATIVE
Trichomonas: NEGATIVE

## 2019-02-05 MED ORDER — FLUCONAZOLE 150 MG PO TABS: 150.0000 mg | ORAL_TABLET | Freq: Once | ORAL | 0 refills | Status: AC

## 2019-02-05 MED ORDER — METRONIDAZOLE 500 MG PO TABS: 500.0000 mg | ORAL_TABLET | Freq: Two times a day (BID) | ORAL | 0 refills | Status: DC

## 2019-02-05 NOTE — Telephone Encounter (Signed)
Patient calling about test results.  Test was done this past Monday

## 2019-02-26 DIAGNOSIS — B379 Candidiasis, unspecified: Secondary | ICD-10-CM

## 2019-02-26 MED ORDER — FLUCONAZOLE 150 MG PO TABS: 150.0000 mg | ORAL_TABLET | Freq: Once | ORAL | 0 refills | Status: AC

## 2019-03-02 ENCOUNTER — Other Ambulatory Visit (HOSPITAL_COMMUNITY)
Admission: RE | Admit: 2019-03-02 | Discharge: 2019-03-02 | Disposition: A | Discharge status: Home / Self Care | Payer: BC Managed Care – PPO | Source: Ambulatory Visit | Attending: Family Medicine | Admitting: Family Medicine

## 2019-03-02 ENCOUNTER — Ambulatory Visit (INDEPENDENT_AMBULATORY_CARE_PROVIDER_SITE_OTHER): Payer: BC Managed Care – PPO

## 2019-03-02 ENCOUNTER — Other Ambulatory Visit: Payer: Self-pay

## 2019-03-02 DIAGNOSIS — Z5189 Encounter for other specified aftercare: Secondary | ICD-10-CM | POA: Insufficient documentation

## 2019-03-02 DIAGNOSIS — Z3202 Encounter for pregnancy test, result negative: Secondary | ICD-10-CM | POA: Diagnosis not present

## 2019-03-02 LAB — POCT PREGNANCY, URINE: Preg Test, Ur: NEGATIVE

## 2019-03-02 NOTE — Progress Notes (Signed)
Pt hear for [facility] & she wanted to do pregnancy test also.Advised results should be back in 24 to 48 hours. Pt verbalized understanding.

## 2019-03-02 NOTE — Progress Notes (Signed)
Patient seen and assessed by nursing staff during this encounter. I have reviewed the chart and agree with the documentation and plan.  Ugonna Anyanwu, MD 03/02/2019 11:58 AM    

## 2019-03-04 ENCOUNTER — Other Ambulatory Visit: Payer: Self-pay | Admitting: Obstetrics & Gynecology

## 2019-03-04 DIAGNOSIS — B373 Candidiasis of vulva and vagina: Secondary | ICD-10-CM

## 2019-03-04 DIAGNOSIS — B9689 Other specified bacterial agents as the cause of diseases classified elsewhere: Secondary | ICD-10-CM

## 2019-03-04 DIAGNOSIS — N760 Acute vaginitis: Secondary | ICD-10-CM

## 2019-03-04 DIAGNOSIS — B3731 Acute candidiasis of vulva and vagina: Secondary | ICD-10-CM

## 2019-03-04 LAB — CERVICOVAGINAL ANCILLARY ONLY
Bacterial vaginitis: POSITIVE — AB
Candida vaginitis: POSITIVE — AB
Chlamydia: NEGATIVE
Neisseria Gonorrhea: NEGATIVE
Trichomonas: NEGATIVE

## 2019-03-04 MED ORDER — FLUCONAZOLE 150 MG PO TABS: 150.0000 mg | ORAL_TABLET | Freq: Once | ORAL | 3 refills | Status: AC

## 2019-03-04 MED ORDER — METRONIDAZOLE 500 MG PO TABS: 500.0000 mg | ORAL_TABLET | Freq: Two times a day (BID) | ORAL | 0 refills | Status: DC

## 2019-03-06 ENCOUNTER — Telehealth: Payer: Self-pay | Admitting: Obstetrics & Gynecology

## 2019-03-06 MED ORDER — FLUCONAZOLE 150 MG PO TABS: 150.0000 mg | ORAL_TABLET | Freq: Once | ORAL | 0 refills | Status: AC

## 2019-03-06 NOTE — Addendum Note (Signed)
Addended by: Michel Harrow on: 03/06/2019 10:02 AM   Modules accepted: Orders

## 2019-03-06 NOTE — Telephone Encounter (Signed)
Called pt and pt informed me that she always get a yeast infection after taking the tablets for BV.  I explained to the pt that it is common and that I will send in an Rx Diflucan to her [place].  I advised pt to please make sure that completes her seven day course of Flagyl and wait to see if she gets a yeast infection then take the Diflucan.  Pt verbalized understanding.

## 2019-03-06 NOTE — Telephone Encounter (Signed)
The patient called in to schedule an appointment for her 4-week follow up. The patient stated we started her on pills. She has about 7 days of taking them, has some concerns. Informed the patient I would send a message to the nurse.

## 2019-03-20 ENCOUNTER — Other Ambulatory Visit: Payer: Self-pay

## 2019-03-20 ENCOUNTER — Other Ambulatory Visit: Payer: Self-pay | Admitting: *Deleted

## 2019-03-20 ENCOUNTER — Other Ambulatory Visit: Payer: Self-pay | Admitting: Obstetrics & Gynecology

## 2019-03-20 DIAGNOSIS — B373 Candidiasis of vulva and vagina: Secondary | ICD-10-CM

## 2019-03-20 DIAGNOSIS — B9689 Other specified bacterial agents as the cause of diseases classified elsewhere: Secondary | ICD-10-CM

## 2019-03-20 DIAGNOSIS — B3731 Acute candidiasis of vulva and vagina: Secondary | ICD-10-CM

## 2019-03-20 DIAGNOSIS — N898 Other specified noninflammatory disorders of vagina: Secondary | ICD-10-CM

## 2019-03-20 MED ORDER — FLUCONAZOLE 150 MG PO TABS: 150.0000 mg | ORAL_TABLET | Freq: Once | ORAL | 0 refills | Status: AC

## 2019-03-21 ENCOUNTER — Other Ambulatory Visit: Payer: Self-pay

## 2019-03-21 ENCOUNTER — Ambulatory Visit (HOSPITAL_COMMUNITY)
Admission: EM | Admit: 2019-03-21 | Discharge: 2019-03-21 | Disposition: A | Discharge status: Home / Self Care | Payer: BC Managed Care – PPO | Attending: Emergency Medicine | Admitting: Emergency Medicine

## 2019-03-21 ENCOUNTER — Encounter (HOSPITAL_COMMUNITY): Payer: Self-pay

## 2019-03-21 DIAGNOSIS — Z8742 Personal history of other diseases of the female genital tract: Secondary | ICD-10-CM | POA: Diagnosis not present

## 2019-03-21 DIAGNOSIS — N760 Acute vaginitis: Secondary | ICD-10-CM | POA: Diagnosis not present

## 2019-03-21 MED ORDER — METRONIDAZOLE 500 MG PO TABS: 500.0000 mg | ORAL_TABLET | Freq: Two times a day (BID) | ORAL | 0 refills | Status: DC

## 2019-03-21 MED ORDER — FLUCONAZOLE 150 MG PO TABS: 150.0000 mg | ORAL_TABLET | Freq: Once | ORAL | 0 refills | Status: AC

## 2019-03-21 NOTE — ED Triage Notes (Signed)
Pt present vaginal itching, symptoms started over a week ago.pt states her discharge is kinda of cottage.

## 2019-03-21 NOTE — Discharge Instructions (Signed)
We will repeat swab to check for yeast and BV and causes of itching  Start taking metronidazole twice daily for 1 week, do not drink alcohol until completion of medicine Take 1 tablet of Diflucan today, repeat second tablet after finishing course of metronidazole if still having symptoms  Follow-up with OB/GYN as planned to discuss recurrent Bv/yeast May try probiotics-daily activity a yogurt or daily over-the-counter probiotic supplement

## 2019-03-21 NOTE — ED Provider Notes (Signed)
[facility]    CSN: [id number] Arrival date & time: 03/21/19  1117      History   Chief Complaint Chief Complaint  Patient presents with  . Vaginal Itching    HPI Rebecca Church is a 23 y.o. female history of recurrent vaginitis presenting today for evaluation of vaginal itching.  Patient states that over the past week she has had vaginal itching.  She has had slight thickening discharge.  She notes that over the past month she has had recurrent BV and yeast.  She has plans to follow-up with OB/GYN on 9/10 to discuss this.  She feels often taking the medicine for BV triggers the yeast and then use triggers the BV.  At times she will have both.  Denies any urinary symptoms.  Denies any new partners or new concerns for STDs.  Denies pelvic pain.  Denies rashes or lesions.  HPI  Past Medical History:  Diagnosis Date  . Gonorrhea 02/02/2019    Patient Active Problem List   Diagnosis Date Noted  . Gonorrhea 02/02/2019  . Bacterial vaginosis 12/23/2017  . Vaginal candidiasis 12/23/2017    Past Surgical History:  Procedure Laterality Date  . NO PAST SURGERIES      OB History    Gravida  0   Para  0   Term  0   Preterm  0   AB  0   Living  0     SAB  0   TAB  0   Ectopic  0   Multiple  0   Live Births  0            Home Medications    Prior to Admission medications   Medication Sig Start Date End Date Taking? Authorizing Provider  fluconazole (DIFLUCAN) 150 MG tablet Take 1 tablet (150 mg total) by mouth once for 1 dose. 03/21/19 03/21/19  Brigette Hopfer C, PA-C  metroNIDAZOLE (FLAGYL) 500 MG tablet Take 1 tablet (500 mg total) by mouth 2 (two) times daily. 03/21/19   Nixon Sparr, Junius CreamerHallie C, PA-C    Family History History reviewed. No pertinent family history.  Social History Social History   Tobacco Use  . Smoking status: Never Smoker  . Smokeless tobacco: Never Used  Substance Use Topics  . Alcohol use: No    Comment: occasionally  .  Drug use: No     Allergies   Patient has no known allergies.   Review of Systems Review of Systems  Constitutional: Negative for fever.  Respiratory: Negative for shortness of breath.   Cardiovascular: Negative for chest pain.  Gastrointestinal: Negative for abdominal pain, diarrhea, nausea and vomiting.  Genitourinary: Positive for vaginal discharge. Negative for dysuria, flank pain, genital sores, hematuria, menstrual problem, vaginal bleeding and vaginal pain.  Musculoskeletal: Negative for back pain.  Skin: Negative for rash.  Neurological: Negative for dizziness, light-headedness and headaches.     Physical Exam Triage Vital Signs ED Triage Vitals [03/21/19 1157]  Enc Vitals Group     BP 105/70     Pulse Rate 69     Resp 16     Temp 98.9 F (37.2 C)     Temp Source Skin     SpO2 100 %     Weight      Height      Head Circumference      Peak Flow      Pain Score 0     Pain Loc  Pain Edu?      Excl. in [place]?    No data found.  Updated Vital Signs BP 105/70 (BP Location: Right Arm)   Pulse 69   Temp 98.9 F (37.2 C) (Skin)   Resp 16   LMP 03/16/2019   SpO2 100%   Visual Acuity Right Eye Distance:   Left Eye Distance:   Bilateral Distance:    Right Eye Near:   Left Eye Near:    Bilateral Near:     Physical Exam Vitals signs and nursing note reviewed.  Constitutional:      Appearance: She is well-developed.     Comments: No acute distress  HENT:     Head: Normocephalic and atraumatic.     Nose: Nose normal.  Eyes:     Conjunctiva/sclera: Conjunctivae normal.  Neck:     Musculoskeletal: Neck supple.  Cardiovascular:     Rate and Rhythm: Normal rate.  Pulmonary:     Effort: Pulmonary effort is normal. No respiratory distress.  Abdominal:     General: There is no distension.     Comments: Nontender to light palpation throughout abdomen  Musculoskeletal: Normal range of motion.  Skin:    General: Skin is warm and dry.  Neurological:      Mental Status: She is alert and oriented to person, place, and time.      UC Treatments / Results  Labs (all labs ordered are listed, but only abnormal results are displayed) Labs Reviewed  CERVICOVAGINAL ANCILLARY ONLY    EKG   Radiology No results found.  Procedures Procedures (including critical care time)  Medications Ordered in UC Medications - No data to display  Initial Impression / Assessment and Plan / UC Course  I have reviewed the triage vital signs and the nursing notes.  Pertinent labs & imaging results that were available during my care of the patient were reviewed by me and considered in my medical decision making (see chart for details).     Will retreat for yeast and BV based off symptoms and history.  Follow-up with OB/GYN as planned on 9/10.  Discussed use of probiotics. Discussed strict return precautions. Patient verbalized understanding and is agreeable with plan.  Final Clinical Impressions(s) / UC Diagnoses   Final diagnoses:  Vaginitis and vulvovaginitis     Discharge Instructions     We will repeat swab to check for yeast and BV and causes of itching  Start taking metronidazole twice daily for 1 week, do not drink alcohol until completion of medicine Take 1 tablet of Diflucan today, repeat second tablet after finishing course of metronidazole if still having symptoms  Follow-up with OB/GYN as planned to discuss recurrent Bv/yeast May try probiotics-daily activity a yogurt or daily over-the-counter probiotic supplement   ED Prescriptions    Medication Sig Dispense Auth. Provider   metroNIDAZOLE (FLAGYL) 500 MG tablet Take 1 tablet (500 mg total) by mouth 2 (two) times daily. 14 tablet Reygan Heagle C, PA-C   fluconazole (DIFLUCAN) 150 MG tablet Take 1 tablet (150 mg total) by mouth once for 1 dose. 2 tablet Glennie Rodda C, PA-C     Controlled Substance Prescriptions [place] Controlled Substance Registry consulted? Not Applicable    Janith Lima, [place] 03/21/19 1247

## 2019-03-25 LAB — CERVICOVAGINAL ANCILLARY ONLY
Bacterial vaginitis: POSITIVE — AB
Candida vaginitis: POSITIVE — AB
Chlamydia: NEGATIVE
Neisseria Gonorrhea: NEGATIVE
Trichomonas: NEGATIVE

## 2019-03-26 ENCOUNTER — Ambulatory Visit (INDEPENDENT_AMBULATORY_CARE_PROVIDER_SITE_OTHER): Payer: BC Managed Care – PPO | Admitting: General Practice

## 2019-03-26 ENCOUNTER — Other Ambulatory Visit: Payer: Self-pay

## 2019-03-26 DIAGNOSIS — B9689 Other specified bacterial agents as the cause of diseases classified elsewhere: Secondary | ICD-10-CM

## 2019-03-26 DIAGNOSIS — N760 Acute vaginitis: Secondary | ICD-10-CM

## 2019-03-26 NOTE — Progress Notes (Signed)
Patient presents to office today for repeat wet prep due to recent BV. She does however report she was already tested on 9/5 and given the Rx for BV and yeast. Patient states she has been getting yeast and BV constantly for months and cannot seem to get rid of it. She states she has about 2-3 infections per month. Discussed she should begin taking a probiotic daily along with boric acid vaginal suppositories. Also advised she use a gentle laundry detergent such as ones formulated for babies, wear 100% cotton white underwear, use Dove unscented soap, & to use condoms during intercourse. Discussed her body has to establish a normal pH and bacteria balance otherwise infections will keep coming back. Advised she complete current course of Flagyl tomorrow, take 1 Diflucan on Saturday and a repeat dose on Tuesday. Patient verbalized understanding to all & had no questions.  Koren Bound RN BSN 03/26/19

## 2019-03-30 NOTE — Progress Notes (Signed)
I have reviewed this chart and agree with the RN/CMA assessment and management.    Majel Giel C Kalep Full, MD, FACOG Attending Physician, [facility]  

## 2019-04-06 ENCOUNTER — Ambulatory Visit (INDEPENDENT_AMBULATORY_CARE_PROVIDER_SITE_OTHER): Payer: BC Managed Care – PPO

## 2019-04-06 ENCOUNTER — Other Ambulatory Visit: Payer: Self-pay

## 2019-04-06 DIAGNOSIS — Z09 Encounter for follow-up examination after completed treatment for conditions other than malignant neoplasm: Secondary | ICD-10-CM

## 2019-04-06 NOTE — Progress Notes (Addendum)
Pt here today for self swab for TOC of BV from 03/21/19.  Pt denies any pain, bleeding, or discharge since taking the Boric acid for the past seven days. Pt asked how do we know that the medicine worked.   I explained to the pt that we know that the Boric acid or medication is working because her symptoms are gone.  I explained to the pt it seems that she knows her body well and the test today would be too soon since her last one was just 03/21/19.  I asked pt if she wanted to do the test today that would be fine but if it came back positive which is something that she is currently treating with the Boric acid.  Pt stated that she was fine with not testing today.  And that she will treat with Boric as she needs too.    Mel Almond, RN 04/06/19  Patient seen and assessed by nursing staff during this encounter. I have reviewed the chart and agree with the documentation and plan.  Clarisa Fling, NP 04/07/2019 1:06 PM

## 2019-07-14 DIAGNOSIS — T3695XA Adverse effect of unspecified systemic antibiotic, initial encounter: Secondary | ICD-10-CM

## 2019-07-14 DIAGNOSIS — B379 Candidiasis, unspecified: Secondary | ICD-10-CM

## 2019-07-14 DIAGNOSIS — B9689 Other specified bacterial agents as the cause of diseases classified elsewhere: Secondary | ICD-10-CM

## 2019-07-14 MED ORDER — METRONIDAZOLE 500 MG PO TABS: 500.0000 mg | ORAL_TABLET | Freq: Two times a day (BID) | ORAL | 0 refills | Status: DC

## 2019-07-14 MED ORDER — FLUCONAZOLE 150 MG PO TABS: 150.0000 mg | ORAL_TABLET | Freq: Once | ORAL | 0 refills | Status: AC

## 2019-08-18 ENCOUNTER — Encounter (HOSPITAL_COMMUNITY): Payer: Self-pay

## 2019-08-18 ENCOUNTER — Ambulatory Visit (HOSPITAL_COMMUNITY)
Admission: EM | Admit: 2019-08-18 | Discharge: 2019-08-18 | Disposition: A | Discharge status: Home / Self Care | Payer: BC Managed Care – PPO | Attending: Family Medicine | Admitting: Family Medicine

## 2019-08-18 ENCOUNTER — Other Ambulatory Visit: Payer: Self-pay

## 2019-08-18 DIAGNOSIS — N898 Other specified noninflammatory disorders of vagina: Secondary | ICD-10-CM | POA: Insufficient documentation

## 2019-08-18 LAB — POCT URINALYSIS DIP (DEVICE)
Bilirubin Urine: NEGATIVE
Glucose, UA: NEGATIVE mg/dL
Hgb urine dipstick: NEGATIVE
Ketones, ur: NEGATIVE mg/dL
Leukocytes,Ua: NEGATIVE
Nitrite: NEGATIVE
Protein, ur: NEGATIVE mg/dL
Specific Gravity, Urine: 1.025 (ref 1.005–1.030)
Urobilinogen, UA: 0.2 mg/dL (ref 0.0–1.0)
pH: 7.5 (ref 5.0–8.0)

## 2019-08-18 MED ORDER — FLUCONAZOLE 150 MG PO TABS: 150.0000 mg | ORAL_TABLET | Freq: Every day | ORAL | 0 refills | Status: DC

## 2019-08-18 MED ORDER — METRONIDAZOLE 0.75 % VA GEL: 1.0000 | Freq: Every day | VAGINAL | 0 refills | Status: AC

## 2019-08-18 NOTE — ED Triage Notes (Signed)
Pt c/o "black specks" when she urinates, especially in morning. Also c/o urinary frequency, urgency. Denies fever, chills, abd pain.  Also states vag discharge clear/white, with some malodor, mild vag itching. Just completed flagyl and abx tx.

## 2019-08-18 NOTE — Discharge Instructions (Signed)
Use the medication as prescribed.  Follow up with your OB/GYN We will call you with any positive results

## 2019-08-19 NOTE — ED Provider Notes (Signed)
[facility]    CSN: [id number] Arrival date & time: 08/18/19  1329      History   Chief Complaint Chief Complaint  Patient presents with  . Exposure to STD    HPI Rebecca Church is a 24 y.o. female.   Pt is a 24 year old female that presents with urinary frequency, urgency, clear/white vaginal discharge with odor.  Mild vaginal itching.  Has chronic recurrent BV and yeast.  Recently has been attempting to use boric acid but feels like this is not working.  Sexually active with same partner over the past year.Patient's last menstrual period was 08/03/2019 (approximate).  No associated abdominal pain, back pain, fever.  ROS per HPI       Past Medical History:  Diagnosis Date  . Gonorrhea 02/02/2019    Patient Active Problem List   Diagnosis Date Noted  . Gonorrhea 02/02/2019  . Bacterial vaginosis 12/23/2017  . Vaginal candidiasis 12/23/2017    Past Surgical History:  Procedure Laterality Date  . NO PAST SURGERIES      OB History    Gravida  0   Para  0   Term  0   Preterm  0   AB  0   Living  0     SAB  0   TAB  0   Ectopic  0   Multiple  0   Live Births  0            Home Medications    Prior to Admission medications   Medication Sig Start Date End Date Taking? Authorizing Provider  fluconazole (DIFLUCAN) 150 MG tablet Take 1 tablet (150 mg total) by mouth daily. 08/18/19   Dahlia Byes A, NP  metroNIDAZOLE (METROGEL) 0.75 % vaginal gel Place 1 Applicatorful vaginally at bedtime for 5 days. 08/18/19 08/23/19  Janace Aris, NP    Family History Family History  Problem Relation Age of Onset  . Healthy Mother   . Healthy Father     Social History Social History   Tobacco Use  . Smoking status: Never Smoker  . Smokeless tobacco: Never Used  Substance Use Topics  . Alcohol use: No    Comment: occasionally  . Drug use: No     Allergies   Patient has no known allergies.   Review of Systems Review of  Systems   Physical Exam Triage Vital Signs ED Triage Vitals  Enc Vitals Group     BP 08/18/19 1435 114/70     Pulse Rate 08/18/19 1435 71     Resp 08/18/19 1435 16     Temp 08/18/19 1435 98.1 F (36.7 C)     Temp Source 08/18/19 1435 Oral     SpO2 08/18/19 1435 100 %     Weight --      Height --      Head Circumference --      Peak Flow --      Pain Score 08/18/19 1456 0     Pain Loc --      Pain Edu? --      Excl. in GC? --    No data found.  Updated Vital Signs BP 114/70 (BP Location: Left Arm)   Pulse 71   Temp 98.1 F (36.7 C) (Oral)   Resp 16   LMP 08/03/2019 (Approximate)   SpO2 100%   Visual Acuity Right Eye Distance:   Left Eye Distance:   Bilateral Distance:    Right Eye  Near:   Left Eye Near:    Bilateral Near:     Physical Exam Vitals and nursing note reviewed.  Constitutional:      General: She is not in acute distress.    Appearance: Normal appearance. She is not ill-appearing, toxic-appearing or diaphoretic.  HENT:     Head: Normocephalic.     Nose: Nose normal.  Eyes:     Conjunctiva/sclera: Conjunctivae normal.  Pulmonary:     Effort: Pulmonary effort is normal.  Musculoskeletal:        General: Normal range of motion.     Cervical back: Normal range of motion.  Skin:    General: Skin is warm and dry.     Findings: No rash.  Neurological:     Mental Status: She is alert.  Psychiatric:        Mood and Affect: Mood normal.      UC Treatments / Results  Labs (all labs ordered are listed, but only abnormal results are displayed) Labs Reviewed  POCT URINALYSIS DIP (DEVICE)  CERVICOVAGINAL ANCILLARY ONLY    EKG   Radiology No results found.  Procedures Procedures (including critical care time)  Medications Ordered in UC Medications - No data to display  Initial Impression / Assessment and Plan / UC Course  I have reviewed the triage vital signs and the nursing notes.  Pertinent labs & imaging results that were  available during my care of the patient were reviewed by me and considered in my medical decision making (see chart for details).     Vaginal discharge-patient with chronic BV and yeast Reporting similar symptoms.  We will go ahead and treat for BV and yeast pending swab results. Urine negative for pregnancy and infection Follow up with OB/GYN Final Clinical Impressions(s) / UC Diagnoses   Final diagnoses:  Vaginal discharge     Discharge Instructions     Use the medication as prescribed.  Follow up with your OB/GYN We will call you with any positive results    ED Prescriptions    Medication Sig Dispense Auth. Provider   metroNIDAZOLE (METROGEL) 0.75 % vaginal gel Place 1 Applicatorful vaginally at bedtime for 5 days. 50 g Kiree Dejarnette A, NP   fluconazole (DIFLUCAN) 150 MG tablet Take 1 tablet (150 mg total) by mouth daily. 2 tablet Loura Halt A, NP     PDMP not reviewed this encounter.   Orvan July, NP 08/19/19 [phone]

## 2019-08-21 LAB — CERVICOVAGINAL ANCILLARY ONLY
Bacterial vaginitis: POSITIVE — AB
Candida vaginitis: NEGATIVE
Chlamydia: NEGATIVE
Neisseria Gonorrhea: NEGATIVE
Trichomonas: NEGATIVE

## 2019-08-27 DIAGNOSIS — N760 Acute vaginitis: Secondary | ICD-10-CM

## 2019-08-27 MED ORDER — METRONIDAZOLE 0.75 % VA GEL: VAGINAL | 5 refills | Status: DC

## 2019-08-27 MED ORDER — BORIC ACID CRYS: 600.0000 mg | CRYSTALS | Freq: Every day | 5 refills | Status: DC

## 2019-09-24 ENCOUNTER — Encounter: Payer: Self-pay | Admitting: Obstetrics and Gynecology

## 2019-09-24 ENCOUNTER — Ambulatory Visit (INDEPENDENT_AMBULATORY_CARE_PROVIDER_SITE_OTHER): Payer: BC Managed Care – PPO | Admitting: Obstetrics and Gynecology

## 2019-09-24 ENCOUNTER — Other Ambulatory Visit (HOSPITAL_COMMUNITY)
Admission: RE | Admit: 2019-09-24 | Discharge: 2019-09-24 | Disposition: A | Discharge status: Home / Self Care | Payer: BC Managed Care – PPO | Source: Ambulatory Visit | Attending: Obstetrics and Gynecology | Admitting: Obstetrics and Gynecology

## 2019-09-24 ENCOUNTER — Other Ambulatory Visit: Payer: Self-pay

## 2019-09-24 VITALS — BP 125/76 | HR 80 | Wt 198.0 lb

## 2019-09-24 DIAGNOSIS — Z01419 Encounter for gynecological examination (general) (routine) without abnormal findings: Secondary | ICD-10-CM | POA: Insufficient documentation

## 2019-09-24 NOTE — Progress Notes (Signed)
    GYNECOLOGY  ENCOUNTER NOTE  History:     Rebecca Church is a 24 y.o. G0P0000 female here for a routine annual gynecologic exam. She thought she was due for an annual and pap however she is not due until 01/2020.  Current complaints: recurrent BV with current partner. Was given boric acid however has not been consistent with taking it.    Denies abnormal vaginal bleeding, discharge, pelvic pain, problems with intercourse or other gynecologic concerns.    Gynecologic History No LMP recorded. Contraception: none- Declines  Last Pap: 01/2019. Results were: normal with negative HPV Last mammogram: NA.  Obstetric History OB History  Gravida Para Term Preterm AB Living  0 0 0 0 0 0  SAB TAB Ectopic Multiple Live Births  0 0 0 0 0    Past Medical History:  Diagnosis Date  . Gonorrhea 02/02/2019    Past Surgical History:  Procedure Laterality Date  . NO PAST SURGERIES      Current Outpatient Medications on File Prior to Visit  Medication Sig Dispense Refill  . Boric Acid CRYS 600 mg by Does not apply route at bedtime. QHS x 2 weeks then twice a week for 6 months (Patient not taking: Reported on 09/24/2019) 500 g 5  . fluconazole (DIFLUCAN) 150 MG tablet Take 1 tablet (150 mg total) by mouth daily. (Patient not taking: Reported on 09/24/2019) 2 tablet 0  . metroNIDAZOLE (METROGEL VAGINAL) 0.75 % vaginal gel 1 applicator x 10 days then twice a week for 6 months (Patient not taking: Reported on 09/24/2019) 70 g 5   No current facility-administered medications on file prior to visit.    No Known Allergies  Social History:  reports that she has never smoked. She has never used smokeless tobacco. She reports that she does not drink alcohol or use drugs.  Family History  Problem Relation Age of Onset  . Healthy Mother   . Healthy Father     The following portions of the patient's history were reviewed and updated as appropriate: allergies, current medications, past family history, past  medical history, past social history, past surgical history and problem list.  Review of Systems Pertinent items noted in HPI and remainder of comprehensive ROS otherwise negative.  Physical Exam:  BP 125/76   Pulse 80   Wt 198 lb (89.8 kg)   BMI 27.62 kg/m  CONSTITUTIONAL: Well-developed, well-nourished female in no acute distress.  HENT:  Normocephalic, atraumatic, External right and left ear normal. Oropharynx is clear and moist EYES: Conjunctivae and EOM are normal. Pupils are equal, round, and reactive to light. No scleral icterus.   SKIN: Skin is warm and dry. No rash noted. Not diaphoretic. No erythema. No pallor. MUSCULOSKELETAL: Normal range of motion.  NEUROLOGIC: Alert and oriented to person, place, and time. Normal reflexes, muscle tone coordination.  PSYCHIATRIC: Normal mood and affect. Normal behavior. Normal judgment and thought content.  Assessment and Plan:   She is not due for annual until after 01/2020, she thought she was due for a pap. We discussed recurrent BV and importance of using boric acid as directed. She will follow up after July for annual, breast exam and f/u for recurrent BV.  Discussed personal hygiene and avoid scented soaps. Encouraged probiotic use daily.    Rebecca Church, [name], NP [facility] [employer], [facility]

## 2019-09-25 LAB — CERVICOVAGINAL ANCILLARY ONLY
Bacterial Vaginitis (gardnerella): NEGATIVE
Candida Glabrata: NEGATIVE
Candida Vaginitis: NEGATIVE
Chlamydia: NEGATIVE
Comment: NEGATIVE
Comment: NEGATIVE
Comment: NEGATIVE
Comment: NEGATIVE
Comment: NEGATIVE
Comment: NORMAL
Neisseria Gonorrhea: NEGATIVE
Trichomonas: NEGATIVE

## 2019-10-23 IMAGING — US US OB < 14 WEEKS - US OB TV
1 series · 15 of 28 positions shown · non-contrast
Comparison: None.

CLINICAL DATA: First trimester vaginal bleeding. Beta hCG [DATE].
Gestational age by last menstrual period 11 weeks and 3 days.

EXAM:
OBSTETRIC <14 WK US AND TRANSVAGINAL OB US
TECHNIQUE: Both transabdominal and transvaginal ultrasound examinations were
performed for complete evaluation of the gestation as well as the
maternal uterus, adnexal regions, and pelvic cul-de-sac.
Transvaginal technique was performed to assess early pregnancy.

[Series 1: us ob < 14 weeks - us ob tv · 15 of 53 slices shown]
[im 1/53]
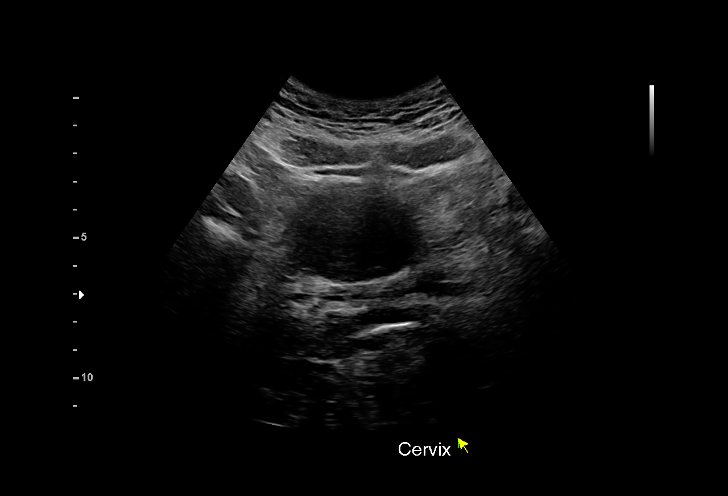
[im 4/53]
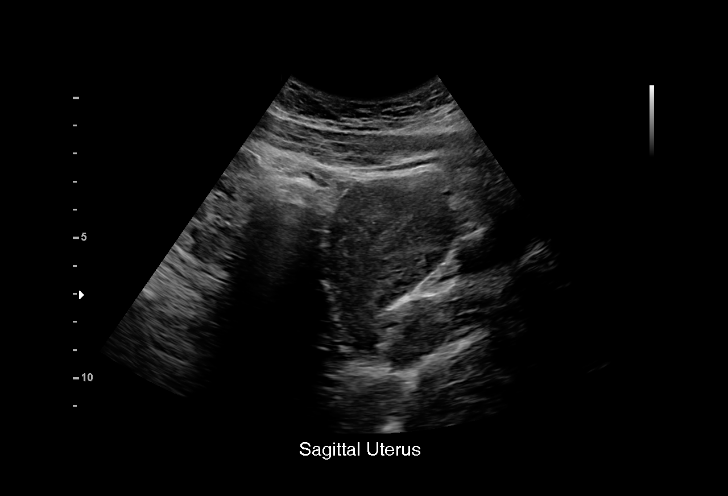
[im 8/53]
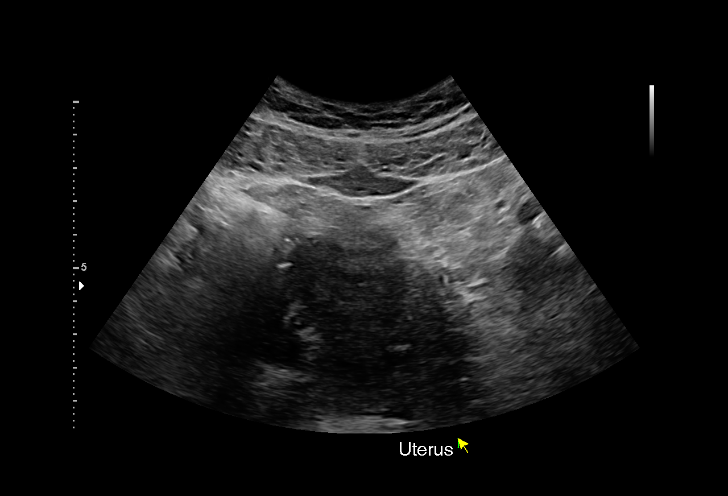
[im 12/53]
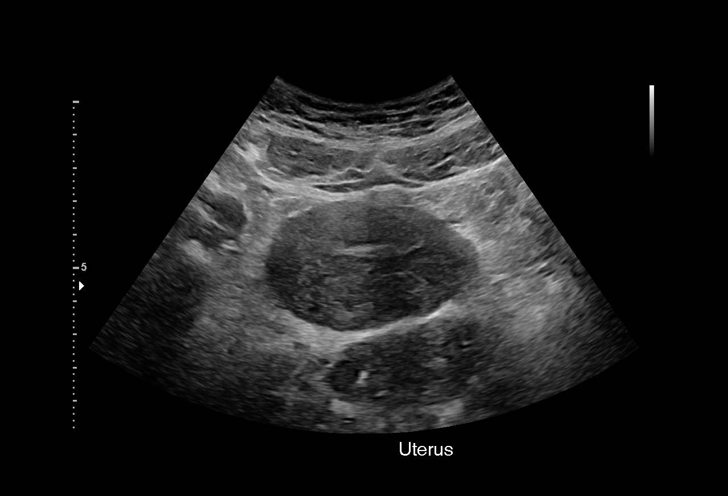
[im 16/53]
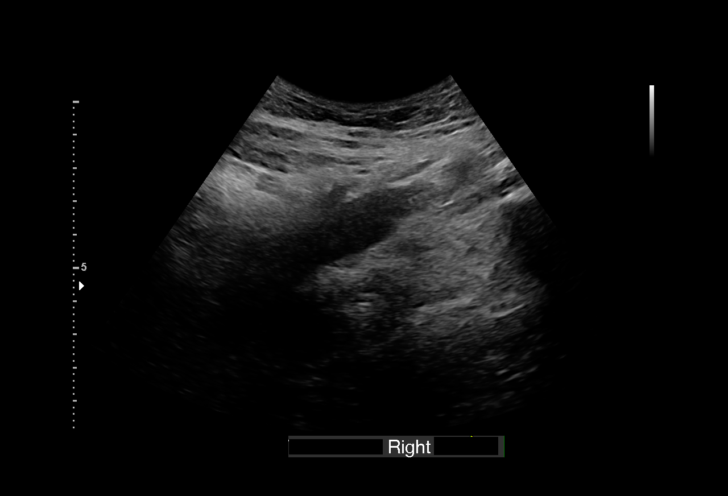
[im 20/53]
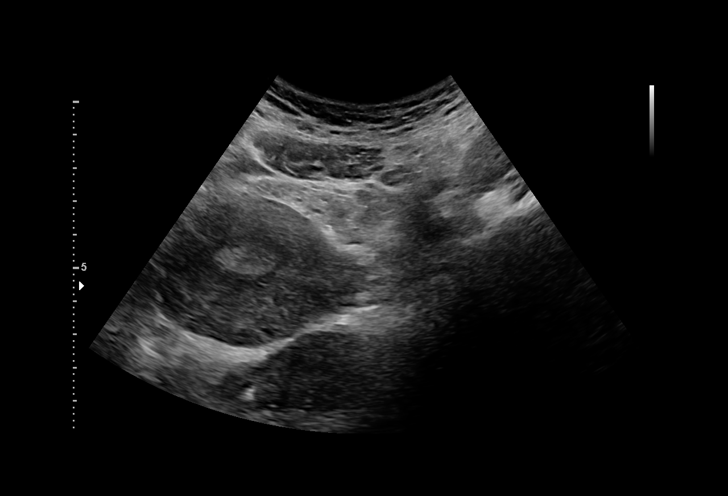
[im 24/53]
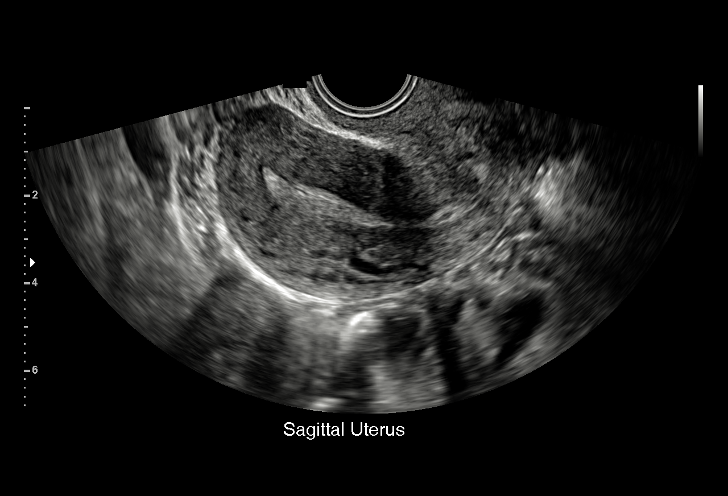
[im 27/53]
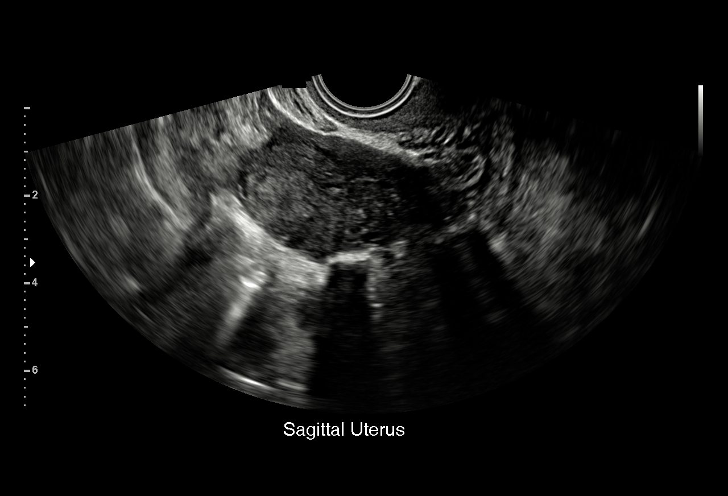
[im 29/53]
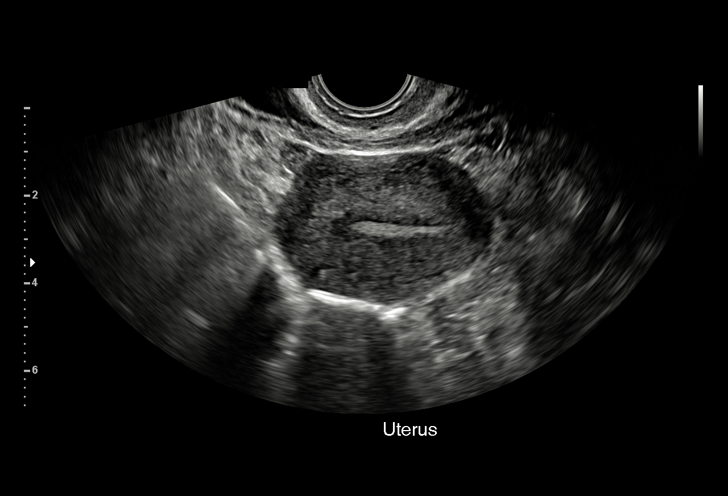
[im 33/53]
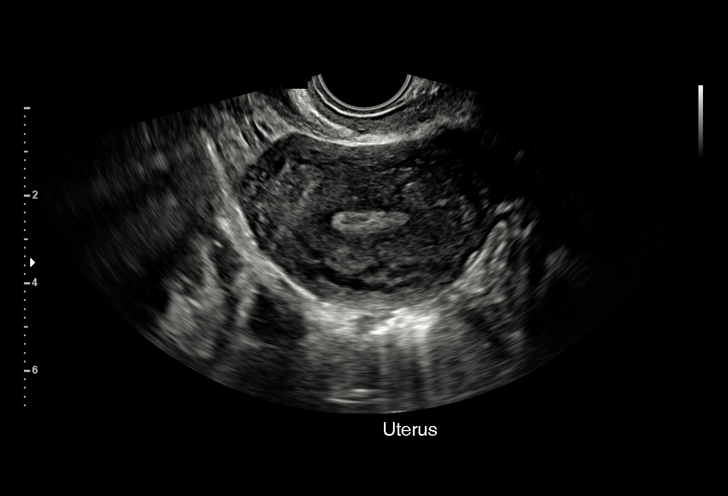
[im 37/53]
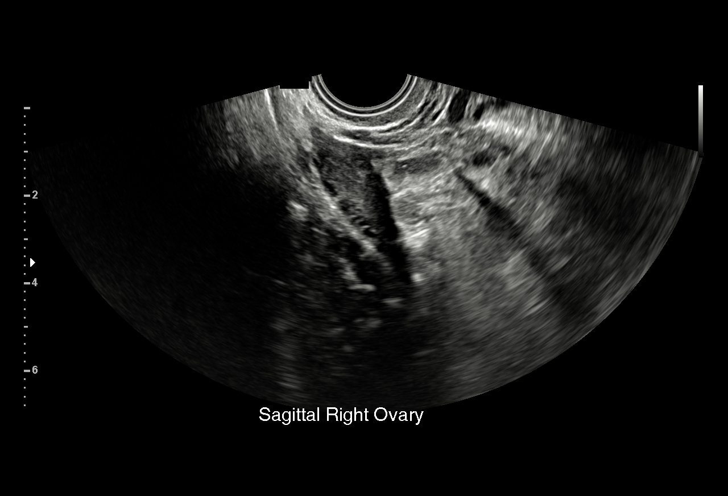
[im 41/53]
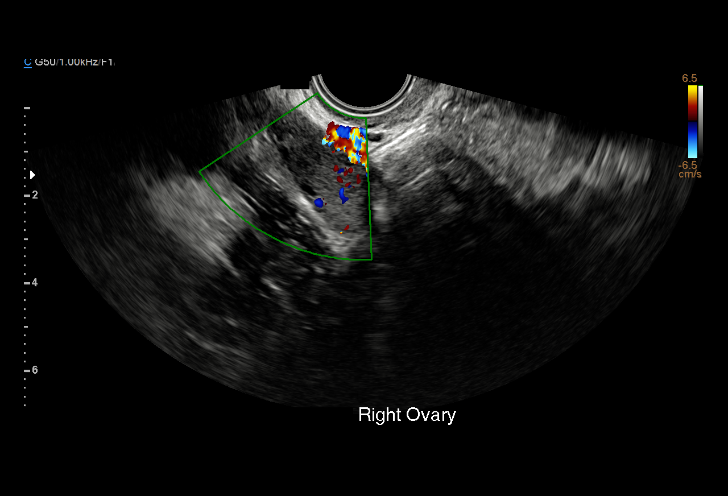
[im 45/53]
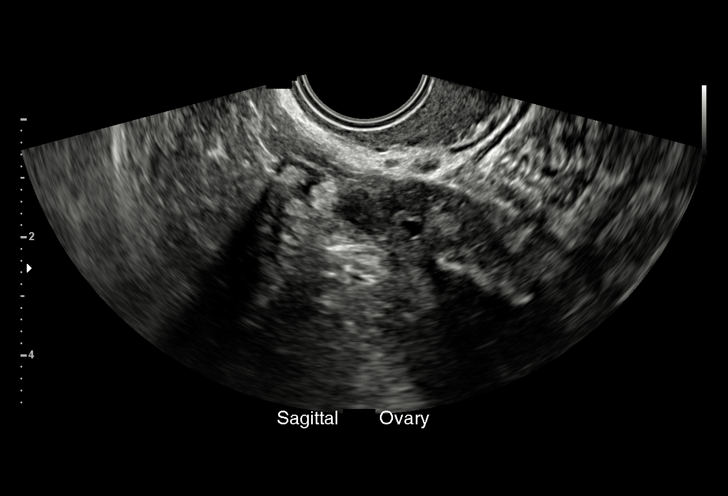
[im 49/53]
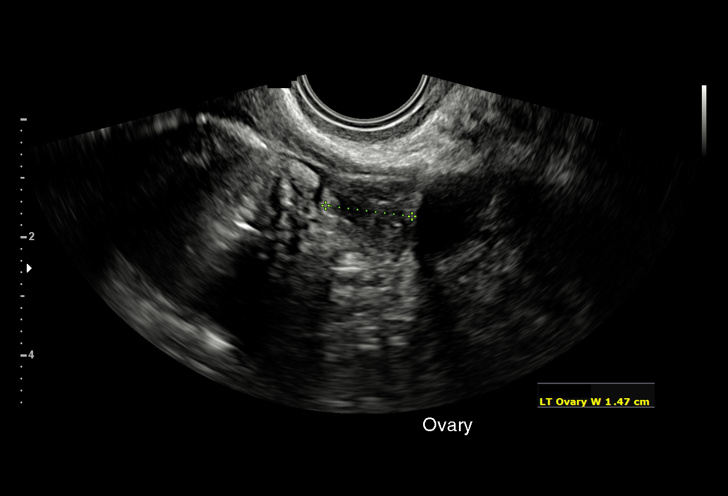
[im 53/53]
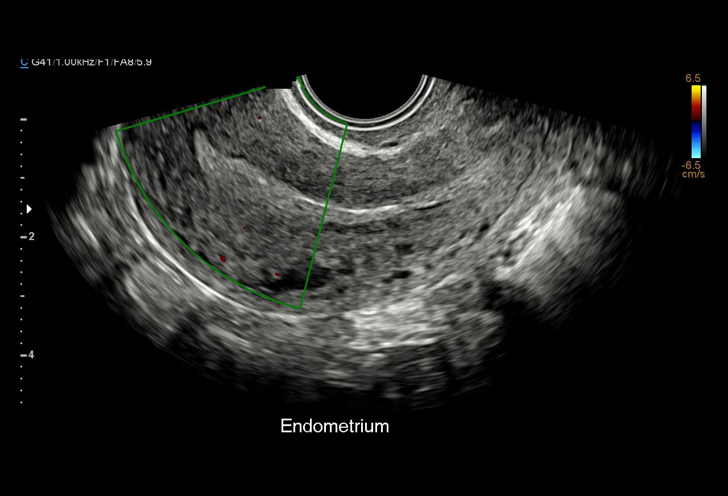

[15 of 28 positions shown; findings below may reference images not displayed]

FINDINGS: Intrauterine gestational sac: Not present. Minimal free fluid in
lower endometrial cavity.

Yolk sac:  Not present.

Embryo:  No present.

Cardiac Activity: Not applicable.

Subchorionic hemorrhage:  No free fluid.

Maternal uterus/adnexae: Normal appearance of the uterus. RIGHT
ovary is 2.8 x 1.4 x 2.2 cm, normal in appearance. LEFT ovary is
x 1.5 x 1.5 cm, normal in appearance.
IMPRESSION: Pregnancy of unknown anatomic location (no intrauterine gestational
sac or adnexal mass identified). Differential diagnosis includes
recent spontaneous miscarriage, IUP too early to visualize, and
non-visualized ectopic pregnancy. Recommend correlation with serial
beta-hCG levels, and follow up US if warranted clinically.

## 2019-11-16 ENCOUNTER — Ambulatory Visit: Payer: BC Managed Care – PPO | Attending: Internal Medicine

## 2019-11-16 DIAGNOSIS — Z23 Encounter for immunization: Secondary | ICD-10-CM

## 2019-11-16 NOTE — Progress Notes (Signed)
   Covid-19 Vaccination Clinic  Name:  Rebecca Church    MRN: [id number] DOB: 23-Sep-1995  11/16/2019  Rebecca Church was observed post Covid-19 immunization for 15 minutes without incident. She was provided with Vaccine Information Sheet and instruction to access the V-Safe system.   Rebecca Church was instructed to call 911 with any severe reactions post vaccine: Marland Kitchen Difficulty breathing  . Swelling of face and throat  . A fast heartbeat  . A bad rash all over body  . Dizziness and weakness   Immunizations Administered    Name Date Dose VIS Date Route   Pfizer COVID-19 Vaccine 11/16/2019 12:51 PM 0.3 mL 09/09/2018 Intramuscular   Manufacturer: [employer], [employer]   Lot: [id number]   NDC: [address]-3

## 2019-12-07 ENCOUNTER — Ambulatory Visit: Payer: BC Managed Care – PPO | Attending: Internal Medicine

## 2019-12-07 DIAGNOSIS — Z23 Encounter for immunization: Secondary | ICD-10-CM

## 2019-12-07 NOTE — Progress Notes (Signed)
   Covid-19 Vaccination Clinic  Name:  Rebecca Church    MRN: [id number] DOB: Jul 04, 1996  12/07/2019  Rebecca Church was observed post Covid-19 immunization for 15 minutes without incident. She was provided with Vaccine Information Sheet and instruction to access the V-Safe system.   Rebecca Church was instructed to call 911 with any severe reactions post vaccine: Marland Kitchen Difficulty breathing  . Swelling of face and throat  . A fast heartbeat  . A bad rash all over body  . Dizziness and weakness   Immunizations Administered    Name Date Dose VIS Date Route   Pfizer COVID-19 Vaccine 12/07/2019 10:11 AM 0.3 mL 09/09/2018 Intramuscular   Manufacturer: [employer], [employer]   Lot: [id number]   NDC: [address]-1

## 2020-04-20 ENCOUNTER — Ambulatory Visit (INDEPENDENT_AMBULATORY_CARE_PROVIDER_SITE_OTHER): Payer: Self-pay | Admitting: *Deleted

## 2020-04-20 ENCOUNTER — Encounter: Payer: Self-pay | Admitting: *Deleted

## 2020-04-20 ENCOUNTER — Other Ambulatory Visit: Payer: Self-pay

## 2020-04-20 VITALS — BP 106/67 | HR 71 | Ht 71.0 in | Wt 196.4 lb

## 2020-04-20 DIAGNOSIS — R350 Frequency of micturition: Secondary | ICD-10-CM

## 2020-04-20 LAB — POCT URINALYSIS DIP (DEVICE)
Bilirubin Urine: NEGATIVE
Glucose, UA: NEGATIVE mg/dL
Hgb urine dipstick: NEGATIVE
Ketones, ur: NEGATIVE mg/dL
Leukocytes,Ua: NEGATIVE
Nitrite: NEGATIVE
Protein, ur: NEGATIVE mg/dL
Specific Gravity, Urine: 1.025 (ref 1.005–1.030)
Urobilinogen, UA: 0.2 mg/dL (ref 0.0–1.0)
pH: 7 (ref 5.0–8.0)

## 2020-04-20 NOTE — Progress Notes (Signed)
Agree with A & P. 

## 2020-04-20 NOTE — Progress Notes (Signed)
Pt presents due to possible UTI and desire for testing. She has been having frequency of urination for the past several months. She denies painful urination. Urinalysis was completed and was not indicative of infection. Pt was informed that she is mildly dehydrated and was encouraged to increase po fluids daily. Per chart review, pt was due for Annual Gyn exam in July. She will schedule appointment upon check out. Pt voiced understanding of all information and instructions given.

## 2020-05-02 ENCOUNTER — Telehealth: Payer: Self-pay

## 2020-05-02 ENCOUNTER — Telehealth: Payer: Self-pay | Admitting: *Deleted

## 2020-05-02 NOTE — Telephone Encounter (Signed)
Rebecca Church called this afternoon and left a message she has an appointment 05/12/20 for an annual gyn exam. Wants to know if she can get tests done for Inflammatory disease, mycoplasm and something else to see if she still has a problem.  I called Tawanda to discuss but got her voicemail; I left a message I was calling her back to discuss and since I did not reach her ; will send MyChart message. Cathan Gearin,RN

## 2020-05-03 NOTE — Telephone Encounter (Signed)
Opened in error

## 2020-05-05 ENCOUNTER — Other Ambulatory Visit: Payer: Self-pay | Admitting: Certified Nurse Midwife

## 2020-05-05 DIAGNOSIS — N760 Acute vaginitis: Secondary | ICD-10-CM

## 2020-05-05 DIAGNOSIS — B9689 Other specified bacterial agents as the cause of diseases classified elsewhere: Secondary | ICD-10-CM

## 2020-05-05 MED ORDER — CLINDAMYCIN HCL 300 MG PO CAPS: 300.0000 mg | ORAL_CAPSULE | Freq: Two times a day (BID) | ORAL | 0 refills | Status: DC

## 2020-05-05 NOTE — Progress Notes (Signed)
Trialing pt on a different treatment for recurrent BV.

## 2020-05-12 ENCOUNTER — Encounter: Payer: Self-pay | Admitting: Certified Nurse Midwife

## 2020-05-12 ENCOUNTER — Ambulatory Visit (INDEPENDENT_AMBULATORY_CARE_PROVIDER_SITE_OTHER): Payer: BC Managed Care – PPO | Admitting: Certified Nurse Midwife

## 2020-05-12 ENCOUNTER — Other Ambulatory Visit: Payer: Self-pay

## 2020-05-12 ENCOUNTER — Other Ambulatory Visit (HOSPITAL_COMMUNITY)
Admission: RE | Admit: 2020-05-12 | Discharge: 2020-05-12 | Disposition: A | Discharge status: Home / Self Care | Payer: BC Managed Care – PPO | Source: Ambulatory Visit | Attending: Certified Nurse Midwife | Admitting: Certified Nurse Midwife

## 2020-05-12 VITALS — BP 110/69 | HR 70 | Wt 195.9 lb

## 2020-05-12 DIAGNOSIS — Z01419 Encounter for gynecological examination (general) (routine) without abnormal findings: Secondary | ICD-10-CM

## 2020-05-12 NOTE — Progress Notes (Signed)
GYNECOLOGY CLINIC ANNUAL PREVENTATIVE CARE ENCOUNTER NOTE  Subjective:   Rebecca Church is a 24 y.o. G0P0000 female here for a routine annual gynecologic exam.  Current complaints: Recurrent BV (just finished clindamycin).   Denies abnormal vaginal bleeding, discharge, pelvic pain, problems with intercourse or other gynecologic concerns.    Gynecologic History Patient's last menstrual period was 03/30/2020 (approximate). Contraception: coitus interruptus Last Pap: 02/02/19. Results were: normal (previous PAP in 2019 [facility]IL) Last mammogram: N/A. Results were: N/A  Obstetric History OB History  Gravida Para Term Preterm AB Living  0 0 0 0 0 0  SAB TAB Ectopic Multiple Live Births  0 0 0 0 0   Past Medical History:  Diagnosis Date  . Gonorrhea 02/02/2019   Past Surgical History:  Procedure Laterality Date  . NO PAST SURGERIES     Current Outpatient Medications on File Prior to Visit  Medication Sig Dispense Refill  . Boric Acid CRYS 600 mg by Does not apply route at bedtime. QHS x 2 weeks then twice a week for 6 months (Patient not taking: Reported on 09/24/2019) 500 g 5   No current facility-administered medications on file prior to visit.   No Known Allergies  Social History   Socioeconomic History  . Marital status: Single    Spouse name: Not on file  . Number of children: Not on file  . Years of education: Not on file  . Highest education level: Not on file  Occupational History  . Not on file  Tobacco Use  . Smoking status: Never Smoker  . Smokeless tobacco: Never Used  Vaping Use  . Vaping Use: Never used  Substance and Sexual Activity  . Alcohol use: No    Comment: occasionally  . Drug use: No  . Sexual activity: Yes    Birth control/protection: Condom  Other Topics Concern  . Not on file  Social History Narrative  . Not on file   Social Determinants of Health   Financial Resource Strain:   . Difficulty of Paying Living Expenses: Not on file  Food  Insecurity: No Food Insecurity  . Worried About Programme researcher, broadcasting/film/video in the Last Year: Never true  . Ran Out of Food in the Last Year: Never true  Transportation Needs: No Transportation Needs  . Lack of Transportation (Medical): No  . Lack of Transportation (Non-Medical): No  Physical Activity:   . Days of Exercise per Week: Not on file  . Minutes of Exercise per Session: Not on file  Stress:   . Feeling of Stress : Not on file  Social Connections:   . Frequency of Communication with Friends and Family: Not on file  . Frequency of Social Gatherings with Friends and Family: Not on file  . Attends Religious Services: Not on file  . Active Member of Clubs or Organizations: Not on file  . Attends Banker Meetings: Not on file  . Marital Status: Not on file  Intimate Partner Violence:   . Fear of Current or Ex-Partner: Not on file  . Emotionally Abused: Not on file  . Physically Abused: Not on file  . Sexually Abused: Not on file   Family History  Problem Relation Age of Onset  . Healthy Mother   . Healthy Father    The following portions of the patient's history were reviewed and updated as appropriate: allergies, current medications, past family history, past medical history, past social history, past surgical history and problem list.  Review of Systems Pertinent items noted in HPI and remainder of comprehensive ROS otherwise negative.   Objective:  BP 110/69   Pulse 70   Wt 195 lb 14.4 oz (88.9 kg)   LMP 03/30/2020 (Approximate)   BMI 27.32 kg/m  CONSTITUTIONAL: Well-developed, well-nourished female in no acute distress.  HENT:  Normocephalic, atraumatic, External right and left ear normal. Oropharynx is clear and moist EYES: Conjunctivae and EOM are normal. Pupils are equal, round, and reactive to light. No scleral icterus.  NECK: Normal range of motion, supple, no masses.  Normal thyroid.  SKIN: Skin is warm and dry. No rash noted. Not diaphoretic. No  erythema. No pallor. NEUROLGIC: Alert and oriented to person, place, and time. Normal reflexes, muscle tone coordination. No cranial nerve deficit noted. PSYCHIATRIC: Normal mood and affect. Normal behavior. Normal judgment and thought content. CARDIOVASCULAR: Normal heart rate noted, regular rhythm RESPIRATORY: Clear to auscultation bilaterally. Effort and breath sounds normal, no problems with respiration noted. BREASTS: Symmetric in size. No masses, skin changes, nipple drainage, or lymphadenopathy. ABDOMEN: Soft, normal bowel sounds, no distention noted.  No tenderness, rebound or guarding.  PELVIC: Normal appearing external genitalia; normal appearing vaginal mucosa and cervix.  No abnormal discharge noted.  Pap smear obtained.  Normal uterine size, no other palpable masses, no uterine or adnexal tenderness. MUSCULOSKELETAL: Normal range of motion. No tenderness.  No cyanosis, clubbing, or edema.  2+ distal pulses.  Assessment:  Annual gynecologic examination with pap smear   Plan:  Will follow up results of pap smear and manage accordingly. Reviewed preventative measures against recurrent BV including using water-based lubricant during sex, use of tea tree tampons the week after her cycle has ended, and taking probiotics daily. Routine preventative health maintenance measures emphasized. Please refer to After Visit Summary for other counseling recommendations.  Follow up in one year for annual exam or sooner if she decides to begin a new method of birth control  Edd Arbour, CNM, MSN, [facility] 05/12/20 12:04 PM

## 2020-05-12 NOTE — Patient Instructions (Signed)
BV Prevention: - Melt 1.4c coconut oil - Add 2-3 drops tea tree essential oil and mix it together - Soak a regular tampon in the mixture, insert and leave it in overnight. Do this monthly a few days after your cycle has ended.

## 2020-05-13 LAB — CYTOLOGY - PAP
Comment: NEGATIVE
Diagnosis: NEGATIVE
High risk HPV: NEGATIVE

## 2020-05-19 ENCOUNTER — Other Ambulatory Visit: Payer: Self-pay

## 2020-05-19 ENCOUNTER — Ambulatory Visit (INDEPENDENT_AMBULATORY_CARE_PROVIDER_SITE_OTHER): Payer: BC Managed Care – PPO

## 2020-05-19 VITALS — BP 121/105 | HR 84 | Wt 197.7 lb

## 2020-05-19 DIAGNOSIS — R300 Dysuria: Secondary | ICD-10-CM | POA: Diagnosis not present

## 2020-05-19 LAB — POCT URINALYSIS DIP (DEVICE)
Bilirubin Urine: NEGATIVE
Glucose, UA: NEGATIVE mg/dL
Ketones, ur: NEGATIVE mg/dL
Leukocytes,Ua: NEGATIVE
Nitrite: NEGATIVE
Protein, ur: NEGATIVE mg/dL
Specific Gravity, Urine: >=1.03 (ref 1.005–1.030)
Urobilinogen, UA: 0.2 mg/dL (ref 0.0–1.0)
pH: 7 (ref 5.0–8.0)

## 2020-05-19 NOTE — Progress Notes (Signed)
Pt here 05/19/20 with concern for possible UTI. Reports burning with urination a few days ago. States this began following completion of clindamycin course. Urinalysis not suspicious for UTI. Pt concerned regarding recurrent vaginal irritation, reports some feelings of dryness to outer labia/inner thigh area. Reviewed recent conversation with Dan Humphreys, CNM on 05/12/20, pt reports no new symptoms since this time.   Fleet Contras RN 05/19/20

## 2020-05-21 ENCOUNTER — Ambulatory Visit (HOSPITAL_COMMUNITY)
Admission: EM | Admit: 2020-05-21 | Discharge: 2020-05-21 | Disposition: A | Discharge status: Home / Self Care | Payer: BC Managed Care – PPO | Attending: Urgent Care | Admitting: Urgent Care

## 2020-05-21 ENCOUNTER — Encounter (HOSPITAL_COMMUNITY): Payer: Self-pay | Admitting: Urgent Care

## 2020-05-21 ENCOUNTER — Other Ambulatory Visit: Payer: Self-pay

## 2020-05-21 DIAGNOSIS — N760 Acute vaginitis: Secondary | ICD-10-CM | POA: Insufficient documentation

## 2020-05-21 DIAGNOSIS — Z8742 Personal history of other diseases of the female genital tract: Secondary | ICD-10-CM | POA: Insufficient documentation

## 2020-05-21 DIAGNOSIS — B9689 Other specified bacterial agents as the cause of diseases classified elsewhere: Secondary | ICD-10-CM | POA: Insufficient documentation

## 2020-05-21 DIAGNOSIS — N898 Other specified noninflammatory disorders of vagina: Secondary | ICD-10-CM | POA: Diagnosis present

## 2020-05-21 MED ORDER — TINIDAZOLE 500 MG PO TABS: 1.0000 g | ORAL_TABLET | Freq: Every day | ORAL | 0 refills | Status: DC

## 2020-05-21 MED ORDER — FLUCONAZOLE 150 MG PO TABS: 150.0000 mg | ORAL_TABLET | ORAL | 0 refills | Status: DC

## 2020-05-21 NOTE — ED Provider Notes (Signed)
  [facility]   MRN: [id number] DOB: [birth year]  Subjective:   Rebecca Church is a 24 y.o. female presenting for several week history of recurrent vaginal discharge, genital irritation.  Patient has had significant difficulty with persistent BV infections and has been working with both her primary care doctor and gynecologist.  Her most recent prescription came from her gynecologist and was for clindamycin.  Feels like it helped only while she was on the medication.  Cannot recall if she is done any different with metronidazole or MetroGel.  Diflucan usually works for her yeast infections.  Apparently patient is waiting to get back in with her primary as directed by her gynecologist to get further testing.  Denies fever, nausea, vomiting, belly pain, pelvic pain, flank pain, painful urination.  No current facility-administered medications for this encounter.  Current Outpatient Medications:  .  Boric Acid CRYS, 600 mg by Does not apply route at bedtime. QHS x 2 weeks then twice a week for 6 months (Patient not taking: Reported on 09/24/2019), Disp: 500 g, Rfl: 5   No Known Allergies  Past Medical History:  Diagnosis Date  . Gonorrhea 02/02/2019     Past Surgical History:  Procedure Laterality Date  . NO PAST SURGERIES      Family History  Problem Relation Age of Onset  . Healthy Mother   . Healthy Father     Social History   Tobacco Use  . Smoking status: Never Smoker  . Smokeless tobacco: Never Used  Vaping Use  . Vaping Use: Never used  Substance Use Topics  . Alcohol use: No    Comment: occasionally  . Drug use: No    ROS   Objective:   Vitals: BP 119/67 (BP Location: Right Arm)   Pulse 99   Temp 98.2 F (36.8 C) (Oral)   Resp 17   LMP 05/20/2020   SpO2 100%   Physical Exam Constitutional:      General: She is not in acute distress.    Appearance: Normal appearance. She is well-developed. She is not ill-appearing.  HENT:     Head:  Normocephalic and atraumatic.     Nose: Nose normal.     Mouth/Throat:     Mouth: Mucous membranes are moist.     Pharynx: Oropharynx is clear.  Eyes:     General: No scleral icterus.    Extraocular Movements: Extraocular movements intact.     Pupils: Pupils are equal, round, and reactive to light.  Cardiovascular:     Rate and Rhythm: Normal rate.  Pulmonary:     Effort: Pulmonary effort is normal.  Skin:    General: Skin is warm and dry.  Neurological:     General: No focal deficit present.     Mental Status: She is alert and oriented to person, place, and time.  Psychiatric:        Mood and Affect: Mood normal.        Behavior: Behavior normal.      Assessment and Plan :   PDMP not reviewed this encounter.  1. Bacterial vaginosis   2. Acute vaginitis     Recommended empiric treatment with tinidazole, Diflucan.  Labs pending.  Continue follow-up and further management with her gynecologist or PCP. Counseled patient on potential for adverse effects with medications prescribed/recommended today, ER and return-to-clinic precautions discussed, patient verbalized understanding.    Wallis Bamberg, PA-C 05/21/20 1459

## 2020-05-21 NOTE — ED Triage Notes (Signed)
Pt c/o vaginal irritation for several weeks and has been evaluated by GYN for same and was given clindamycin w/o improvement of s/s.  Pt has been using lubricant during intercourse as directed and has not improved symptoms.  Denies dysuria sx, n/v/d, fever, abdominal/flank/back pain, or vaginal discharge.

## 2020-05-23 ENCOUNTER — Telehealth: Payer: Self-pay

## 2020-05-23 ENCOUNTER — Telehealth: Payer: Self-pay | Admitting: *Deleted

## 2020-05-23 LAB — CERVICOVAGINAL ANCILLARY ONLY
Bacterial Vaginitis (gardnerella): NEGATIVE
Candida Glabrata: NEGATIVE
Candida Vaginitis: NEGATIVE
Chlamydia: NEGATIVE
Comment: NEGATIVE
Comment: NEGATIVE
Comment: NEGATIVE
Comment: NEGATIVE
Comment: NEGATIVE
Comment: NORMAL
Neisseria Gonorrhea: NEGATIVE
Trichomonas: NEGATIVE

## 2020-05-23 NOTE — Telephone Encounter (Signed)
Patient called to speak with a nurse from [facility],and was given the contact number to call for assistance .

## 2020-05-23 NOTE — Telephone Encounter (Signed)
Called pt and informed pt Rebecca Church's recommendation.  Pt informed me that she went to Urgent Care in which they did a urine test and swab.  Pt also stated that she was treated with tindamax and diflucan with one refill that she is take in a week.  I explained to the patient that it sounds as if her concerns have been addressed with her now taking treatment and her urine coming back normal.  I advised pt to give the medication some time and if she has any questions to please give the office a call back.  Pt verbalized understanding.   Addison Naegeli, RN  05/23/20

## 2021-09-21 ENCOUNTER — Other Ambulatory Visit: Payer: Self-pay

## 2021-09-21 ENCOUNTER — Encounter (HOSPITAL_COMMUNITY): Payer: Self-pay

## 2021-09-21 ENCOUNTER — Ambulatory Visit (HOSPITAL_COMMUNITY)
Admission: EM | Admit: 2021-09-21 | Discharge: 2021-09-21 | Disposition: A | Discharge status: Home / Self Care | Payer: BC Managed Care – PPO | Attending: Family Medicine | Admitting: Family Medicine

## 2021-09-21 DIAGNOSIS — N3090 Cystitis, unspecified without hematuria: Secondary | ICD-10-CM | POA: Insufficient documentation

## 2021-09-21 LAB — POCT URINALYSIS DIPSTICK, ED / UC
Glucose, UA: NEGATIVE mg/dL
Ketones, ur: NEGATIVE mg/dL
Nitrite: NEGATIVE
Protein, ur: 100 mg/dL — AB
Specific Gravity, Urine: 1.02 (ref 1.005–1.030)
Urobilinogen, UA: 0.2 mg/dL (ref 0.0–1.0)
pH: 7 (ref 5.0–8.0)

## 2021-09-21 MED ORDER — PHENAZOPYRIDINE HCL 100 MG PO TABS: 100.0000 mg | ORAL_TABLET | Freq: Three times a day (TID) | ORAL | 0 refills | Status: DC | PRN

## 2021-09-21 MED ORDER — CEPHALEXIN 250 MG PO CAPS: 250.0000 mg | ORAL_CAPSULE | Freq: Three times a day (TID) | ORAL | 0 refills | Status: AC

## 2021-09-21 NOTE — Discharge Instructions (Addendum)
Urinalysis had some white blood cells and some red blood cells.  Urine culture is sent ? ?Cephalexin 250 mg is the antibiotic--take 1 capsule 3 times daily for a week ? ?Phenazopyridine 100 mg --1 tab 3 times daily as needed for bladder pain ? ?Staff will call you if it looks like the antibiotic needs to be changed from the urine culture ? ? ?

## 2021-09-21 NOTE — ED Triage Notes (Signed)
Pt c/o possible UTI, she has burning with urination.  ?Started: this week ?

## 2021-09-21 NOTE — ED Provider Notes (Addendum)
?[facility] ? ? ? ?CSN: [id number] ?Arrival date & time: 09/21/21  1532 ? ? ?  ? ?History   ?Chief Complaint ?Chief Complaint  ?Patient presents with  ? Urinary Frequency  ? ? ?HPI ?Rebecca Church is a 26 y.o. female.  ? ? ?Urinary Frequency ? ?Here for dysuria that is been going on for about a week.  She began having her period today on time.  No unusual vaginal discharge.  She does have some urinary frequency, but she comments that she does drink a good bit of water.  No fever or chills or abdominal pain.  No vomiting ? ?Past Medical History:  ?Diagnosis Date  ? Gonorrhea 02/02/2019  ? ? ?Patient Active Problem List  ? Diagnosis Date Noted  ? Gonorrhea 02/02/2019  ? Bacterial vaginosis 12/23/2017  ? Vaginal candidiasis 12/23/2017  ? ? ?Past Surgical History:  ?Procedure Laterality Date  ? NO PAST SURGERIES    ? ? ?OB History   ? ? Gravida  ?0  ? Para  ?0  ? Term  ?0  ? Preterm  ?0  ? AB  ?0  ? Living  ?0  ?  ? ? SAB  ?0  ? IAB  ?0  ? Ectopic  ?0  ? Multiple  ?0  ? Live Births  ?0  ?   ?  ?  ? ? ? ?Home Medications   ? ?Prior to Admission medications   ?Medication Sig Start Date End Date Taking? Authorizing Provider  ?cephALEXin (KEFLEX) 250 MG capsule Take 1 capsule (250 mg total) by mouth 3 (three) times daily for 7 days. 09/21/21 09/28/21 Yes Kathelene Rumberger, Janace Aris, MD  ?phenazopyridine (PYRIDIUM) 100 MG tablet Take 1 tablet (100 mg total) by mouth 3 (three) times daily as needed (urinary pain). 09/21/21  Yes Zenia Resides, MD  ? ? ?Family History ?Family History  ?Problem Relation Age of Onset  ? Healthy Mother   ? Healthy Father   ? ? ?Social History ?Social History  ? ?Tobacco Use  ? Smoking status: Never  ? Smokeless tobacco: Never  ?Vaping Use  ? Vaping Use: Never used  ?Substance Use Topics  ? Alcohol use: No  ?  Comment: occasionally  ? Drug use: No  ? ? ? ?Allergies   ?Patient has no known allergies. ? ? ?Review of Systems ?Review of Systems  ?Genitourinary:  Positive for frequency.  ? ? ?Physical  Exam ?Triage Vital Signs ?ED Triage Vitals  ?Enc Vitals Group  ?   BP 09/21/21 1634 101/68  ?   Pulse Rate 09/21/21 1634 93  ?   Resp 09/21/21 1634 20  ?   Temp 09/21/21 1634 98.7 ?F (37.1 ?C)  ?   Temp Source 09/21/21 1634 Oral  ?   SpO2 09/21/21 1634 98 %  ?   Weight --   ?   Height --   ?   Head Circumference --   ?   Peak Flow --   ?   Pain Score 09/21/21 1632 4  ?   Pain Loc --   ?   Pain Edu? --   ?   Excl. in GC? --   ? ?No data found. ? ?Updated Vital Signs ?BP 101/68 (BP Location: Left Arm)   Pulse 93   Temp 98.7 ?F (37.1 ?C) (Oral)   Resp 20   LMP 09/21/2021   SpO2 98%  ? ?Visual Acuity ?Right Eye Distance:   ?Left Eye Distance:   ?  Bilateral Distance:   ? ?Right Eye Near:   ?Left Eye Near:    ?Bilateral Near:    ? ?Physical Exam ?Vitals reviewed.  ?Constitutional:   ?   General: She is not in acute distress. ?   Appearance: She is not toxic-appearing.  ?Cardiovascular:  ?   Rate and Rhythm: Normal rate and regular rhythm.  ?   Heart sounds: No murmur heard. ?Pulmonary:  ?   Effort: Pulmonary effort is normal.  ?   Breath sounds: Normal breath sounds.  ?Abdominal:  ?   Palpations: Abdomen is soft.  ?   Tenderness: There is no abdominal tenderness.  ?Skin: ?   Coloration: Skin is not jaundiced or pale.  ?Neurological:  ?   Mental Status: She is oriented to person, place, and time.  ?Psychiatric:     ?   Behavior: Behavior normal.  ? ? ? ?UC Treatments / Results  ?Labs ?(all labs ordered are listed, but only abnormal results are displayed) ?Labs Reviewed  ?POCT URINALYSIS DIPSTICK, ED / UC - Abnormal; Notable for the following components:  ?    Result Value  ? Bilirubin Urine SMALL (*)   ? Hgb urine dipstick LARGE (*)   ? Protein, ur 100 (*)   ? Leukocytes,Ua SMALL (*)   ? All other components within normal limits  ?URINE CULTURE  ? ? ?EKG ? ? ?Radiology ?No results found. ? ?Procedures ?Procedures (including critical care time) ? ?Medications Ordered in UC ?Medications - No data to display ? ?Initial  Impression / Assessment and Plan / UC Course  ?I have reviewed the triage vital signs and the nursing notes. ? ?Pertinent labs & imaging results that were available during my care of the patient were reviewed by me and considered in my medical decision making (see chart for details). ? ?  ? ?UA shows small amount of white blood cells and a large amount of blood.  We will send urine culture.  And will treat with Keflex ?Final Clinical Impressions(s) / UC Diagnoses  ? ?Final diagnoses:  ?Cystitis  ? ? ? ?Discharge Instructions   ? ?  ?Urinalysis had some white blood cells and some red blood cells.  Urine culture is sent ? ?Cephalexin 250 mg is the antibiotic--take 1 capsule 3 times daily for a week ? ?Phenazopyridine 100 mg --1 tab 3 times daily as needed for bladder pain ? ?Staff will call you if it looks like the antibiotic needs to be changed from the urine culture ? ? ? ? ? ? ?ED Prescriptions   ? ? Medication Sig Dispense Auth. Provider  ? cephALEXin (KEFLEX) 250 MG capsule Take 1 capsule (250 mg total) by mouth 3 (three) times daily for 7 days. 21 capsule Zenia Resides, MD  ? phenazopyridine (PYRIDIUM) 100 MG tablet Take 1 tablet (100 mg total) by mouth 3 (three) times daily as needed (urinary pain). 10 tablet Zenia Resides, MD  ? ?  ? ?PDMP not reviewed this encounter. ?  ?Zenia Resides, MD ?09/21/21 1711 ? ?  ?Zenia Resides, MD ?09/21/21 1712 ? ?

## 2021-09-23 LAB — URINE CULTURE: Culture: >=100000 — AB

## 2021-09-26 ENCOUNTER — Ambulatory Visit (HOSPITAL_COMMUNITY)
Admission: EM | Admit: 2021-09-26 | Discharge: 2021-09-26 | Disposition: A | Discharge status: Home / Self Care | Payer: Medicaid Other | Attending: Family Medicine | Admitting: Family Medicine

## 2021-09-26 ENCOUNTER — Other Ambulatory Visit: Payer: Self-pay

## 2021-09-26 ENCOUNTER — Telehealth (HOSPITAL_COMMUNITY): Payer: Self-pay | Admitting: Emergency Medicine

## 2021-09-26 DIAGNOSIS — R300 Dysuria: Secondary | ICD-10-CM | POA: Insufficient documentation

## 2021-09-26 DIAGNOSIS — N3090 Cystitis, unspecified without hematuria: Secondary | ICD-10-CM | POA: Insufficient documentation

## 2021-09-26 NOTE — Telephone Encounter (Signed)
PAtient called with concerns for no cytology results on  my chart from recent visit.  Reviewed with Dr. Fatima Sanger, who states okay for patient to come back for nurse visit recollect on cyto.  Reviewed with patient, she verbalized understanding.   ? ?

## 2021-09-26 NOTE — ED Triage Notes (Signed)
Pt here for cytology recollect. ?

## 2021-09-27 LAB — CERVICOVAGINAL ANCILLARY ONLY
Bacterial Vaginitis (gardnerella): POSITIVE — AB
Candida Glabrata: NEGATIVE
Candida Vaginitis: NEGATIVE
Chlamydia: NEGATIVE
Comment: NEGATIVE
Comment: NEGATIVE
Comment: NEGATIVE
Comment: NEGATIVE
Comment: NEGATIVE
Comment: NORMAL
Neisseria Gonorrhea: NEGATIVE
Trichomonas: POSITIVE — AB

## 2021-09-28 ENCOUNTER — Telehealth (HOSPITAL_COMMUNITY): Payer: Self-pay | Admitting: Emergency Medicine

## 2021-09-28 MED ORDER — METRONIDAZOLE 500 MG PO TABS: 500.0000 mg | ORAL_TABLET | Freq: Two times a day (BID) | ORAL | 0 refills | Status: DC

## 2021-10-25 ENCOUNTER — Ambulatory Visit: Payer: Medicaid Other

## 2021-11-24 ENCOUNTER — Ambulatory Visit (HOSPITAL_COMMUNITY)
Admission: EM | Admit: 2021-11-24 | Discharge: 2021-11-24 | Disposition: A | Discharge status: Home / Self Care | Payer: Medicaid Other | Attending: Family Medicine | Admitting: Family Medicine

## 2021-11-24 ENCOUNTER — Encounter (HOSPITAL_COMMUNITY): Payer: Self-pay | Admitting: Emergency Medicine

## 2021-11-24 DIAGNOSIS — N898 Other specified noninflammatory disorders of vagina: Secondary | ICD-10-CM | POA: Insufficient documentation

## 2021-11-24 MED ORDER — METRONIDAZOLE 500 MG PO TABS: 500.0000 mg | ORAL_TABLET | Freq: Two times a day (BID) | ORAL | 0 refills | Status: AC

## 2021-11-24 NOTE — Discharge Instructions (Addendum)
You were seen today for vaginal irritation.  ?I have treated you today for BV with flagyl twice/day x 7 days.  ?Your vaginal swab will be resulted tomorrow and if positive for anything else we will call and notify you.  You will be able to see your result on mychart as well.  ?

## 2021-11-24 NOTE — ED Triage Notes (Signed)
Pt thinks she has BV again. Reports vaginal dryness and irration  ?

## 2021-11-24 NOTE — ED Provider Notes (Signed)
**Note Rebecca-Identified via Obfuscation** ?[facility] ? ? ? ?CSN: [id number] ?Arrival date & time: 11/24/21  4098 ? ? ?  ? ?History   ?Chief Complaint ?Chief Complaint  ?Patient presents with  ? Exposure to STD  ? ? ?HPI ?Rebecca Church is a 26 y.o. female.  ? ?Patient is here for 4-5 days of vaginal irritation and dryness. No d/c noted.  ?No urinary symptoms.  ?She does have a h/o BV every few months.  ?She sexually active with 1 partner, no condom use.  ?No known std exposure.  ? ?Treated in march for bv and trich.  ? ?Declines hiv/rpr testing today . ? ?Past Medical History:  ?Diagnosis Date  ? Gonorrhea 02/02/2019  ? ? ?Patient Active Problem List  ? Diagnosis Date Noted  ? Gonorrhea 02/02/2019  ? Bacterial vaginosis 12/23/2017  ? Vaginal candidiasis 12/23/2017  ? ? ?Past Surgical History:  ?Procedure Laterality Date  ? NO PAST SURGERIES    ? ? ?OB History   ? ? Gravida  ?0  ? Para  ?0  ? Term  ?0  ? Preterm  ?0  ? AB  ?0  ? Living  ?0  ?  ? ? SAB  ?0  ? IAB  ?0  ? Ectopic  ?0  ? Multiple  ?0  ? Live Births  ?0  ?   ?  ?  ? ? ? ?Home Medications   ? ?Prior to Admission medications   ?Not on File  ? ? ?Family History ?Family History  ?Problem Relation Age of Onset  ? Healthy Mother   ? Healthy Father   ? ? ?Social History ?Social History  ? ?Tobacco Use  ? Smoking status: Never  ? Smokeless tobacco: Never  ?Vaping Use  ? Vaping Use: Never used  ?Substance Use Topics  ? Alcohol use: No  ?  Comment: occasionally  ? Drug use: No  ? ? ? ?Allergies   ?Patient has no known allergies. ? ? ?Review of Systems ?Review of Systems  ?Constitutional: Negative.   ?HENT: Negative.    ?Respiratory: Negative.    ?Cardiovascular: Negative.   ?Gastrointestinal: Negative.   ? ? ?Physical Exam ?Triage Vital Signs ?ED Triage Vitals [11/24/21 0935]  ?Enc Vitals Group  ?   BP 99/70  ?   Pulse Rate 73  ?   Resp 15  ?   Temp 98.5 ?F (36.9 ?C)  ?   Temp src   ?   SpO2   ?   Weight   ?   Height   ?   Head Circumference   ?   Peak Flow   ?   Pain Score   ?   Pain Loc   ?    Pain Edu?   ?   Excl. in GC?   ? ?No data found. ? ?Updated Vital Signs ?BP 99/70 (BP Location: Right Arm)   Pulse 73   Temp 98.5 ?F (36.9 ?C)   Resp 15   LMP 11/16/2021  ? ?Visual Acuity ?Right Eye Distance:   ?Left Eye Distance:   ?Bilateral Distance:   ? ?Right Eye Near:   ?Left Eye Near:    ?Bilateral Near:    ? ?Physical Exam ?Constitutional:   ?   Appearance: Normal appearance.  ?HENT:  ?   Head: Normocephalic.  ?Cardiovascular:  ?   Rate and Rhythm: Normal rate.  ?Pulmonary:  ?   Effort: Pulmonary effort is normal.  ?   Breath sounds: Normal breath  sounds.  ?Skin: ?   General: Skin is warm.  ?Neurological:  ?   General: No focal deficit present.  ?   Mental Status: She is alert.  ?Psychiatric:     ?   Mood and Affect: Mood normal.  ? ? ? ?UC Treatments / Results  ?Labs ?(all labs ordered are listed, but only abnormal results are displayed) ?Labs Reviewed  ?CERVICOVAGINAL ANCILLARY ONLY  ? ? ?EKG ? ? ?Radiology ?No results found. ? ?Procedures ?Procedures (including critical care time) ? ?Medications Ordered in UC ?Medications - No data to display ? ?Initial Impression / Assessment and Plan / UC Course  ?I have reviewed the triage vital signs and the nursing notes. ? ?Pertinent labs & imaging results that were available during my care of the patient were reviewed by me and considered in my medical decision making (see chart for details). ? ? ?Final Clinical Impressions(s) / UC Diagnoses  ? ?Final diagnoses:  ?Vaginal discharge  ?Vaginal irritation  ? ? ? ?Discharge Instructions   ? ?  ?You were seen today for vaginal irritation.  ?I have treated you today for BV with flagyl twice/day x 7 days.  ?Your vaginal swab will be resulted tomorrow and if positive for anything else we will call and notify you.  You will be able to see your result on mychart as well.  ? ? ? ?ED Prescriptions   ? ? Medication Sig Dispense Auth. Provider  ? metroNIDAZOLE (FLAGYL) 500 MG tablet Take 1 tablet (500 mg total) by mouth 2  (two) times daily for 7 days. 14 tablet Jannifer Franklin, MD  ? ?  ? ?PDMP not reviewed this encounter. ?  ?Jannifer Franklin, MD ?11/24/21 1002 ? ?

## 2021-11-27 LAB — CERVICOVAGINAL ANCILLARY ONLY
Bacterial Vaginitis (gardnerella): POSITIVE — AB
Candida Glabrata: NEGATIVE
Candida Vaginitis: NEGATIVE
Chlamydia: POSITIVE — AB
Comment: NEGATIVE
Comment: NEGATIVE
Comment: NEGATIVE
Comment: NEGATIVE
Comment: NEGATIVE
Comment: NORMAL
Neisseria Gonorrhea: POSITIVE — AB
Trichomonas: NEGATIVE

## 2021-11-28 ENCOUNTER — Ambulatory Visit (HOSPITAL_COMMUNITY)
Admission: EM | Admit: 2021-11-28 | Discharge: 2021-11-28 | Disposition: A | Discharge status: Home / Self Care | Payer: Medicaid Other | Attending: Internal Medicine | Admitting: Internal Medicine

## 2021-11-28 ENCOUNTER — Encounter (HOSPITAL_COMMUNITY): Payer: Self-pay | Admitting: Emergency Medicine

## 2021-11-28 ENCOUNTER — Telehealth (HOSPITAL_COMMUNITY): Payer: Self-pay

## 2021-11-28 DIAGNOSIS — A549 Gonococcal infection, unspecified: Secondary | ICD-10-CM

## 2021-11-28 MED ORDER — DOXYCYCLINE HYCLATE 100 MG PO CAPS: 100.0000 mg | ORAL_CAPSULE | Freq: Two times a day (BID) | ORAL | 0 refills | Status: AC

## 2021-11-28 MED ORDER — CEFTRIAXONE SODIUM 500 MG IJ SOLR
500.0000 mg | Freq: Once | INTRAMUSCULAR | Status: AC
  Administered 2021-11-28: 500 mg via INTRAMUSCULAR

## 2021-11-28 MED ORDER — CEFTRIAXONE SODIUM 500 MG IJ SOLR
INTRAMUSCULAR | Status: AC
  Filled 2021-11-28: qty 500

## 2021-11-28 NOTE — ED Triage Notes (Signed)
Pt presents to UC for Gonorrhea tx. 500 mg Rocephin IM given per protocol. Pt denies any other concerns.  ?

## 2021-12-19 ENCOUNTER — Other Ambulatory Visit (HOSPITAL_COMMUNITY)
Admission: RE | Admit: 2021-12-19 | Discharge: 2021-12-19 | Disposition: A | Discharge status: Home / Self Care | Payer: Medicaid Other | Source: Ambulatory Visit | Attending: Family Medicine | Admitting: Family Medicine

## 2021-12-19 ENCOUNTER — Ambulatory Visit (INDEPENDENT_AMBULATORY_CARE_PROVIDER_SITE_OTHER): Payer: Self-pay

## 2021-12-19 VITALS — BP 96/63 | HR 78 | Wt 179.6 lb

## 2021-12-19 DIAGNOSIS — Z113 Encounter for screening for infections with a predominantly sexual mode of transmission: Secondary | ICD-10-CM

## 2021-12-19 NOTE — Progress Notes (Signed)
Patient here today for a retest for Trichomonas. Patient requested to be retested for Gonorrhea and Chlamydia as well. Patient tested positive for Gonorrhea and Chlamydia on 11/24/21. Patient began treatment for both on 11/28/21. I explained to patient that although she received and completed treatment, the self swab may come back positive for the presence of Gonorrhea and Chlamydia as it has only been 2 weeks since completing treatment for Chlamydia and 3 weeks since completing treatment for Gonorrhea. Patient verbalized understanding and stated she still wanted to have testing for both done. I instructed patient on how to collect self swab. Self swab collected without issue. I informed patient we will notify her with any abnormal results.   Alesia Richards, RN 12/19/21

## 2021-12-20 LAB — CERVICOVAGINAL ANCILLARY ONLY
Chlamydia: POSITIVE — AB
Comment: NEGATIVE
Comment: NEGATIVE
Comment: NORMAL
Neisseria Gonorrhea: NEGATIVE
Trichomonas: NEGATIVE

## 2021-12-21 ENCOUNTER — Other Ambulatory Visit: Payer: Self-pay

## 2021-12-21 ENCOUNTER — Telehealth: Payer: Self-pay

## 2021-12-21 MED ORDER — DOXYCYCLINE HYCLATE 100 MG PO TABS: 100.0000 mg | ORAL_TABLET | Freq: Two times a day (BID) | ORAL | 0 refills | Status: DC

## 2021-12-21 NOTE — Addendum Note (Signed)
Addended by: Denyce Robert E on: 12/21/2021 11:32 AM   Modules accepted: Orders

## 2021-12-21 NOTE — Telephone Encounter (Signed)
I called patient and reviewed results with her. Patient aware that positive chlamydia result is not clinically meaningful due to patient just receiving treatment for chlamydia only 2 weeks prior. I explained to patient she should have a chlamydia retest in 3 months from treatment. Appointment scheduled. Patient verbalized understanding and denies any other questions.   Alesia Richards, RN 12/21/21

## 2021-12-21 NOTE — Telephone Encounter (Addendum)
-----   Message from Venora Maples, MD sent at 12/20/2021  4:59 PM EDT ----- Patient insisted on self swab although not medically advised. She was previously diagnosed and treated for chlamydia only two weeks ago. Please let patient know this test is not clinically meaningful and she should still repeat TOC in 3 months from treatment.

## 2021-12-21 NOTE — Telephone Encounter (Signed)
Attempted to call patient regarding positive results. Unable to make contact with patient, no voicemail option available. Rx for Doxycycline sent to pharmacy on file   [place], [place]

## 2022-03-07 ENCOUNTER — Other Ambulatory Visit: Payer: Self-pay

## 2022-03-07 ENCOUNTER — Other Ambulatory Visit (HOSPITAL_COMMUNITY)
Admission: RE | Admit: 2022-03-07 | Discharge: 2022-03-07 | Disposition: A | Discharge status: Home / Self Care | Payer: Medicaid Other | Source: Ambulatory Visit | Attending: Family Medicine | Admitting: Family Medicine

## 2022-03-07 ENCOUNTER — Ambulatory Visit (INDEPENDENT_AMBULATORY_CARE_PROVIDER_SITE_OTHER): Payer: Self-pay

## 2022-03-07 VITALS — BP 107/75 | HR 78 | Wt 183.8 lb

## 2022-03-07 DIAGNOSIS — Z113 Encounter for screening for infections with a predominantly sexual mode of transmission: Secondary | ICD-10-CM | POA: Insufficient documentation

## 2022-03-07 DIAGNOSIS — A749 Chlamydial infection, unspecified: Secondary | ICD-10-CM

## 2022-03-07 DIAGNOSIS — Z3200 Encounter for pregnancy test, result unknown: Secondary | ICD-10-CM

## 2022-03-07 DIAGNOSIS — Z3202 Encounter for pregnancy test, result negative: Secondary | ICD-10-CM

## 2022-03-07 LAB — POCT PREGNANCY, URINE: Preg Test, Ur: NEGATIVE

## 2022-03-07 NOTE — Progress Notes (Signed)
Here today for TOC of chlamydia following treatment on 12/21/21. Pt requests full STD screening; appropriate labs ordered. Pt also desires UPT. Reports regular and recent unprotected intercourse. UPT today is negative. Does not desire pregnancy. Declines birth control.   Fleet Contras RN 03/07/22

## 2022-03-08 LAB — CERVICOVAGINAL ANCILLARY ONLY
Chlamydia: NEGATIVE
Comment: NEGATIVE
Comment: NEGATIVE
Comment: NORMAL
Neisseria Gonorrhea: NEGATIVE
Trichomonas: NEGATIVE

## 2022-03-08 LAB — HIV ANTIBODY (ROUTINE TESTING W REFLEX): HIV Screen 4th Generation wRfx: NONREACTIVE

## 2022-03-08 LAB — HEPATITIS C ANTIBODY: Hep C Virus Ab: NONREACTIVE

## 2022-03-08 LAB — HEPATITIS B SURFACE ANTIGEN: Hepatitis B Surface Ag: NEGATIVE

## 2022-03-08 LAB — RPR: RPR Ser Ql: NONREACTIVE

## 2022-04-09 ENCOUNTER — Encounter (HOSPITAL_COMMUNITY): Payer: Self-pay | Admitting: Emergency Medicine

## 2022-04-09 ENCOUNTER — Ambulatory Visit (HOSPITAL_COMMUNITY)
Admission: EM | Admit: 2022-04-09 | Discharge: 2022-04-09 | Disposition: A | Discharge status: Home / Self Care | Payer: Medicaid Other | Attending: Emergency Medicine | Admitting: Emergency Medicine

## 2022-04-09 DIAGNOSIS — N898 Other specified noninflammatory disorders of vagina: Secondary | ICD-10-CM | POA: Insufficient documentation

## 2022-04-09 LAB — POC URINE PREG, ED: Preg Test, Ur: NEGATIVE

## 2022-04-09 MED ORDER — FLUCONAZOLE 150 MG PO TABS: 150.0000 mg | ORAL_TABLET | Freq: Every day | ORAL | 0 refills | Status: AC

## 2022-04-09 NOTE — ED Provider Notes (Signed)
[facility]    CSN: [id number] Arrival date & time: 04/09/22  0901      History   Chief Complaint Chief Complaint  Patient presents with   Vaginal Itching    HPI Rebecca Church is a 26 y.o. female.   Patient presents with thick Pavlos Yon vaginal discharge with mild itching , mild odor and skin irritation for 3 to 4 days.  Has not attempted treatment of symptoms.  Sexually active, 1 partner, no condom use, no known exposure.  Last menstrual period 03/12/2022.  Denies urinary symptoms, lower abdominal pain or pressure, flank pain, new rash or lesions.    Past Medical History:  Diagnosis Date   Gonorrhea 02/02/2019    Patient Active Problem List   Diagnosis Date Noted   Bacterial vaginosis 12/23/2017   Vaginal candidiasis 12/23/2017    Past Surgical History:  Procedure Laterality Date   NO PAST SURGERIES      OB History     Gravida  0   Para  0   Term  0   Preterm  0   AB  0   Living  0      SAB  0   IAB  0   Ectopic  0   Multiple  0   Live Births  0            Home Medications    Prior to Admission medications   Not on File    Family History Family History  Problem Relation Age of Onset   Healthy Mother    Healthy Father     Social History Social History   Tobacco Use   Smoking status: Never   Smokeless tobacco: Never  Vaping Use   Vaping Use: Never used  Substance Use Topics   Alcohol use: No    Comment: occasionally   Drug use: No     Allergies   Patient has no known allergies.   Review of Systems Review of Systems  Constitutional: Negative.   Respiratory: Negative.    Genitourinary:  Positive for vaginal discharge and vaginal pain. Negative for decreased urine volume, difficulty urinating, dyspareunia, dysuria, enuresis, flank pain, frequency, genital sores, hematuria, menstrual problem, pelvic pain, urgency and vaginal bleeding.  Skin: Negative.   Neurological: Negative.      Physical Exam Triage  Vital Signs ED Triage Vitals [04/09/22 1038]  Enc Vitals Group     BP 92/65     Pulse Rate 89     Resp 16     Temp 99.2 F (37.3 C)     Temp Source Oral     SpO2 97 %     Weight      Height      Head Circumference      Peak Flow      Pain Score 0     Pain Loc      Pain Edu?      Excl. in [place]?    No data found.  Updated Vital Signs BP 92/65 (BP Location: Right Arm)   Pulse 89   Temp 99.2 F (37.3 C) (Oral)   Resp 16   LMP 03/12/2022   SpO2 97%   Visual Acuity Right Eye Distance:   Left Eye Distance:   Bilateral Distance:    Right Eye Near:   Left Eye Near:    Bilateral Near:     Physical Exam Constitutional:      Appearance: Normal appearance.  Eyes:  Extraocular Movements: Extraocular movements intact.  Abdominal:     General: Abdomen is flat.     Palpations: Abdomen is soft.  Genitourinary:    Comments: Deferred Neurological:     Mental Status: She is alert and oriented to person, place, and time. Mental status is at baseline.  Psychiatric:        Mood and Affect: Mood normal.        Behavior: Behavior normal.      UC Treatments / Results  Labs (all labs ordered are listed, but only abnormal results are displayed) Labs Reviewed  CERVICOVAGINAL ANCILLARY ONLY    EKG   Radiology No results found.  Procedures Procedures (including critical care time)  Medications Ordered in UC Medications - No data to display  Initial Impression / Assessment and Plan / UC Course  I have reviewed the triage vital signs and the nursing notes.  Pertinent labs & imaging results that were available during my care of the patient were reviewed by me and considered in my medical decision making (see chart for details).  Vaginal discharge   We will prophylactically treat for yeast based on symptomology, Diflucan prescribed, urine pregnancy negative, discussed administration, STI labs pending will treat per protocol, advised abstinence until lab results,  and/or treatment is complete, advised condom use during all sexual encounters moving, may follow-up with urgent care as needed  Final Clinical Impressions(s) / UC Diagnoses   Final diagnoses:  None   Discharge Instructions   None    ED Prescriptions   None    PDMP not reviewed this encounter.   Hans Eden, NP 04/09/22 1058

## 2022-04-09 NOTE — ED Triage Notes (Signed)
Pt reports that for a few days had vaginal itching and white "cottage cheese" like discharge.

## 2022-04-09 NOTE — Discharge Instructions (Signed)
Today you are being treated prophylactically for yeast.   Urine pregnancy test negative  Take diflucan 150 mg once, if symptoms still present in 3 days then you may take second pill   Yeast infections which are caused by a naturally occurring fungus called candida. Vaginosis is an inflammation of the vagina that can result in discharge, itching and pain. The cause is usually a change in the normal balance of vaginal bacteria or an infection. Vaginosis can also result from reduced estrogen levels after menopause.  Labs pending 2-3 days, you will be contacted if positive for any sti and treatment will be sent to the pharmacy, you will have to return to the clinic if positive for gonorrhea to receive treatment   Please refrain from having sex until labs results, if positive please refrain from having sex until treatment complete and symptoms resolve   If positive for Chlamydia  gonorrhea or trichomoniasis please notify partner or partners so they may tested as well  Moving forward, it is recommended you use some form of protection against the transmission of sti infections  such as condoms or dental dams with each sexual encounter    In addition:   Avoid baths, hot tubs and whirlpool spas.  Don't use scented or harsh soaps, such as those with deodorant or antibacterial action. Avoid irritants. These include scented tampons and pads. Wipe from front to back after using the toilet.  Don't douche. Your vagina doesn't require cleansing other than normal bathing.  Use a  condom. Wear cotton underwear, this fabric helps absorb moisture

## 2022-04-10 LAB — CERVICOVAGINAL ANCILLARY ONLY
Bacterial Vaginitis (gardnerella): POSITIVE — AB
Candida Glabrata: NEGATIVE
Candida Vaginitis: POSITIVE — AB
Chlamydia: NEGATIVE
Comment: NEGATIVE
Comment: NEGATIVE
Comment: NEGATIVE
Comment: NEGATIVE
Comment: NEGATIVE
Comment: NORMAL
Neisseria Gonorrhea: NEGATIVE
Trichomonas: NEGATIVE

## 2022-04-11 ENCOUNTER — Telehealth (HOSPITAL_COMMUNITY): Payer: Self-pay | Admitting: Emergency Medicine

## 2022-04-11 MED ORDER — METRONIDAZOLE 500 MG PO TABS: 500.0000 mg | ORAL_TABLET | Freq: Two times a day (BID) | ORAL | 0 refills | Status: DC

## 2022-04-26 ENCOUNTER — Ambulatory Visit: Payer: Medicaid Other

## 2022-05-03 ENCOUNTER — Other Ambulatory Visit (HOSPITAL_COMMUNITY)
Admission: RE | Admit: 2022-05-03 | Discharge: 2022-05-03 | Disposition: A | Discharge status: Home / Self Care | Payer: Medicaid Other | Source: Ambulatory Visit | Attending: Family Medicine | Admitting: Family Medicine

## 2022-05-03 ENCOUNTER — Ambulatory Visit (INDEPENDENT_AMBULATORY_CARE_PROVIDER_SITE_OTHER): Payer: Self-pay

## 2022-05-03 ENCOUNTER — Other Ambulatory Visit: Payer: Self-pay

## 2022-05-03 VITALS — BP 124/84 | HR 77 | Wt 190.9 lb

## 2022-05-03 DIAGNOSIS — N898 Other specified noninflammatory disorders of vagina: Secondary | ICD-10-CM | POA: Diagnosis present

## 2022-05-03 DIAGNOSIS — Z3201 Encounter for pregnancy test, result positive: Secondary | ICD-10-CM

## 2022-05-03 DIAGNOSIS — Z3490 Encounter for supervision of normal pregnancy, unspecified, unspecified trimester: Secondary | ICD-10-CM

## 2022-05-03 LAB — POCT PREGNANCY, URINE: Preg Test, Ur: POSITIVE — AB

## 2022-05-03 MED ORDER — TERCONAZOLE 0.4 % VA CREA: 1.0000 | TOPICAL_CREAM | Freq: Every day | VAGINAL | 0 refills | Status: DC

## 2022-05-03 MED ORDER — COMPLETENATE 29-1 MG PO CHEW: 1.0000 | CHEWABLE_TABLET | Freq: Every day | ORAL | 11 refills | Status: DC

## 2022-05-03 NOTE — Patient Instructions (Signed)

## 2022-05-03 NOTE — Progress Notes (Signed)
Possible Pregnancy + Vaginal Irritation  Here today with complaint of swelling and redness to vulva. Recent vaginal swab shows positive BV and yeast on 04/09/22. Pt reports taking both Diflucan and Flagyl to treat these. Continued to have symptoms following treatment vaginal self swab completed. Will contact pt with any abnormal results. Terazol vaginal cream ordered per protocol for treatment of likely continued yeast infection.   Pt reports faintly positive UPT at home yesterday. UPT in office today is positive. Reviewed dating with patient:   LMP: 03/12/22 EDD: 12/17/22 7w 3d today  Patient reports regular, monthly periods. OB history reviewed; single previous miscarriage. Pt states this is an unplanned but desired pregnancy. Reviewed medications and allergies with patient; list of medications safe to take during pregnancy given.  Recommended pt begin prenatal vitamin and schedule prenatal care. Pt will schedule prenatal care during check out.  Annabell Howells, RN 05/03/2022  9:58 AM

## 2022-05-04 LAB — CERVICOVAGINAL ANCILLARY ONLY
Bacterial Vaginitis (gardnerella): NEGATIVE
Candida Glabrata: NEGATIVE
Candida Vaginitis: POSITIVE — AB
Chlamydia: NEGATIVE
Comment: NEGATIVE
Comment: NEGATIVE
Comment: NEGATIVE
Comment: NEGATIVE
Comment: NEGATIVE
Comment: NORMAL
Neisseria Gonorrhea: NEGATIVE
Trichomonas: NEGATIVE

## 2022-05-07 ENCOUNTER — Other Ambulatory Visit: Payer: Self-pay | Admitting: Obstetrics & Gynecology

## 2022-05-07 DIAGNOSIS — B3731 Acute candidiasis of vulva and vagina: Secondary | ICD-10-CM

## 2022-05-07 MED ORDER — FLUCONAZOLE 150 MG PO TABS: 150.0000 mg | ORAL_TABLET | Freq: Once | ORAL | 3 refills | Status: AC

## 2022-05-31 ENCOUNTER — Telehealth (INDEPENDENT_AMBULATORY_CARE_PROVIDER_SITE_OTHER): Payer: Self-pay

## 2022-05-31 DIAGNOSIS — Z3480 Encounter for supervision of other normal pregnancy, unspecified trimester: Secondary | ICD-10-CM | POA: Insufficient documentation

## 2022-05-31 DIAGNOSIS — Z3689 Encounter for other specified antenatal screening: Secondary | ICD-10-CM

## 2022-05-31 NOTE — Progress Notes (Signed)
New OB Intake  I connected with Rebecca Church on 05/31/22 at  9:15 AM EST by MyChart Video Visit and verified that I am speaking with the correct person using two identifiers. Nurse is located at [facility] and pt is located at home.  I discussed the limitations, risks, security and privacy concerns of performing an evaluation and management service by telephone and the availability of in person appointments. I also discussed with the patient that there may be a patient responsible charge related to this service. The patient expressed understanding and agreed to proceed.  I explained I am completing New OB Intake today. We discussed EDD of 12/17/2022 that is based on LMP of 03/12/2022 . Pt is G1/P0. I reviewed her allergies, medications, Medical/Surgical/OB history, and appropriate screenings. I informed her of [facility] services. [facility] information placed in AVS. Based on history, this is a low risk pregnancy.  Patient Active Problem List   Diagnosis Date Noted   Supervision of other normal pregnancy, antepartum 05/31/2022     Concerns addressed today -Patient had pap done on 05/12/2020; Normal result.   Delivery Plans Plans to deliver at [facility]. Patient given information for [facility] Healthy Baby website for more information about [facility]. Patient is not interested in water birth. Offered upcoming OB visit with CNM to discuss further.  MyChart/Babyscripts MyChart access verified. I explained pt will have some visits in office and some virtually. Babyscripts instructions given and order placed. Patient verifies receipt of registration text/e-mail. Account successfully created and app downloaded.  Blood Pressure Cuff/Weight Scale Patient stated she will apply for Medicaid, once approve, we can send a BP Machine script and weight scale to [employer].   Anatomy US Explained first scheduled [place] will be around 19 weeks. Anatomy US scheduled for 07/23/2022 at 09:45 AM. Pt notified  to arrive at 09:30 AM.  Labs Discussed [employer] genetic screening with patient. Would like both Panorama and Horizon drawn at new OB visit. Routine prenatal labs needed.  COVID Vaccine Patient has had COVID vaccine.   Is patient a CenteringPregnancy candidate?  Declined Declined due to Enrolled in [facility]  Is patient a Mom+Baby Combined Care candidate?  Accepted   If accepted, Mom+Baby staff notified  Social Determinants of Health Food Insecurity: Patient denies food insecurity. WIC Referral: Patient is interested in referral to [facility].  Transportation: Patient denies transportation needs. Childcare: Discussed no children allowed at ultrasound appointments. Offered childcare services; patient declines childcare services at this time.  First visit review I reviewed new OB appt with patient. I explained they will have a provider visit that includes initial ob labs, genetic screening, and may require a pelvic exam. Explained pt will be seen by Federico Flake MD at first visit; encounter routed to appropriate provider. Explained that patient will be seen by pregnancy navigator following visit with provider.   Vidal Schwalbe, [place] 05/31/2022  9:24 AM

## 2022-06-18 ENCOUNTER — Other Ambulatory Visit (HOSPITAL_COMMUNITY)
Admission: RE | Admit: 2022-06-18 | Discharge: 2022-06-18 | Disposition: A | Discharge status: Home / Self Care | Payer: Medicaid Other | Source: Ambulatory Visit | Attending: Family Medicine | Admitting: Family Medicine

## 2022-06-18 ENCOUNTER — Ambulatory Visit (INDEPENDENT_AMBULATORY_CARE_PROVIDER_SITE_OTHER): Payer: Medicaid Other | Admitting: Family Medicine

## 2022-06-18 VITALS — BP 107/74 | HR 91 | Wt 201.3 lb

## 2022-06-18 DIAGNOSIS — Z3480 Encounter for supervision of other normal pregnancy, unspecified trimester: Secondary | ICD-10-CM

## 2022-06-18 DIAGNOSIS — Z3482 Encounter for supervision of other normal pregnancy, second trimester: Secondary | ICD-10-CM

## 2022-06-18 DIAGNOSIS — Z0289 Encounter for other administrative examinations: Secondary | ICD-10-CM

## 2022-06-18 DIAGNOSIS — Z3A14 14 weeks gestation of pregnancy: Secondary | ICD-10-CM

## 2022-06-18 DIAGNOSIS — Z1332 Encounter for screening for maternal depression: Secondary | ICD-10-CM

## 2022-06-18 MED ORDER — BLOOD PRESSURE KIT DEVI: 1.0000 | 0 refills | Status: DC

## 2022-06-18 NOTE — Progress Notes (Signed)
INITIAL PRENATAL VISIT  Subjective:   Rebecca Church is being seen today for her first obstetrical visit.  This is a planned pregnancy. This is not a desired pregnancy.  She is at [redacted]w[redacted]d gestation by LMP.  Her obstetrical history is significant for  None . Relationship with FOB: significant other, living together. Patient does intend to breast feed. Pregnancy history fully reviewed.  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   Indications for ASA therapy (per uptodate) One of the following: Previous pregnancy with preeclampsia, especially early onset and with an adverse outcome No Multifetal gestation No Chronic hypertension No Type 1 or 2 diabetes mellitus No Chronic kidney disease No Autoimmune disease (antiphospholipid syndrome, systemic lupus erythematosus) No  Two or more of the following: Nulliparity Yes Obesity (body mass index >30 kg/m2) No Family history of preeclampsia in mother or sister No Age ?35 years No Sociodemographic characteristics (African American race, low socioeconomic level) Yes Personal risk factors (eg, previous pregnancy with low birth weight or small for gestational age infant, previous adverse pregnancy outcome [eg, stillbirth], interval >10 years between pregnancies) No  Indications for early GDM screening  First-degree relative with diabetes No BMI >30kg/m2 No Age > 25 Yes Previous birth of an infant weighing ?4000 g No Gestational diabetes mellitus in a previous pregnancy No Glycated hemoglobin ?5.7 percent (39 mmol/mol), impaired glucose tolerance, or impaired fasting glucose on previous testing No High-risk race/ethnicity (eg, African American, Latino, Native American, Asian American, Pacific Islander) Yes Previous stillbirth of unknown cause No Maternal birthweight > 9 lbs No History of cardiovascular disease No Hypertension or on therapy for hypertension No High-density lipoprotein cholesterol level <35 mg/dL (3.78 mmol/L) and/or a  triglyceride level >250 mg/dL (5.88 mmol/L) No Polycystic ovary syndrome No Physical inactivity No Other clinical condition associated with insulin resistance (eg, severe obesity, acanthosis nigricans) No Current use of glucocorticoids No   Early screening tests: FBS, A1C, Random CBG, glucose challenge   Review of Systems:   Review of Systems  Objective:    Obstetric History OB History  Gravida Para Term Preterm AB Living  1 0 0 0 0 0  SAB IAB Ectopic Multiple Live Births  0 0 0 0 0    # Outcome Date GA Lbr Len/2nd Weight Sex Delivery Anes PTL Lv  1 Current             Past Medical History:  Diagnosis Date   Gonorrhea 02/02/2019    Past Surgical History:  Procedure Laterality Date   NO PAST SURGERIES      Current Outpatient Medications on File Prior to Visit  Medication Sig Dispense Refill   prenatal vitamin w/FE, FA (NATACHEW) 29-1 MG CHEW chewable tablet Chew 1 tablet by mouth daily at 12 noon. 30 tablet 11   No current facility-administered medications on file prior to visit.    No Known Allergies  Social History:  reports that she has never smoked. She has never used smokeless tobacco. She reports that she does not drink alcohol and does not use drugs.  Family History  Problem Relation Age of Onset   Healthy Mother    Healthy Father     The following portions of the patient's history were reviewed and updated as appropriate: allergies, current medications, past family history, past medical history, past social history, past surgical history and problem list.  Review of Systems Review of Systems  Constitutional:  Negative for chills and fever.  HENT:  Negative for congestion and sore  throat.   Eyes:  Negative for pain and visual disturbance.  Respiratory:  Negative for cough, chest tightness and shortness of breath.   Cardiovascular:  Negative for chest pain.  Gastrointestinal:  Negative for abdominal pain, diarrhea, nausea and vomiting.  Endocrine:  Negative for cold intolerance and heat intolerance.  Genitourinary:  Negative for dysuria and flank pain.  Musculoskeletal:  Negative for back pain.  Skin:  Negative for rash.  Allergic/Immunologic: Negative for food allergies.  Neurological:  Negative for dizziness and light-headedness.  Psychiatric/Behavioral:  Negative for agitation.       Physical Exam:  BP 107/74   Pulse 91   Wt 201 lb 4.8 oz (91.3 kg)   LMP 03/12/2022 (Exact Date)   BMI 28.08 kg/m  CONSTITUTIONAL: Well-developed, well-nourished female in no acute distress.  HENT:  Normocephalic, atraumatic, External right and left ear normal. Oropharynx is clear and moist EYES: Conjunctivae normal. No scleral icterus.  NECK: Normal range of motion, supple, no masses.  Normal thyroid.  SKIN: Skin is warm and dry. No rash noted. Not diaphoretic. No erythema. No pallor. MUSCULOSKELETAL: Normal range of motion. No tenderness.  No cyanosis, clubbing, or edema.   NEUROLOGIC: Alert and oriented to person, place, and time. Normal muscle tone coordination.  PSYCHIATRIC: Normal mood and affect. Normal behavior. Normal judgment and thought content. CARDIOVASCULAR: Normal heart rate noted, regular rhythm RESPIRATORY: Clear to auscultation bilaterally. Effort and breath sounds normal, no problems with respiration noted. BREASTS: Symmetric in size. No masses, skin changes, nipple drainage, or lymphadenopathy. ABDOMEN: Soft, normal bowel sounds, no distention noted.  No tenderness, rebound or guarding. Fundal ht: below pelvis PELVIC: Not indicated FHR: 154   Assessment:    Pregnancy: G1P0000 1. Supervision of other normal pregnancy, antepartum Discussed that she does not meet criteria for ASA treatment Family history of twins, BSUS today did not show two infants, has formal US in 4 weeks.  Reviewed model of care - CBC/D/Plt+RPR+Rh+ABO+RubIgG... - Culture, OB Urine - Hemoglobin A1c - GC/Chlamydia probe amp ([facility])not at [facility] -  Panorama Prenatal Test Full Panel - HORIZON CUSTOM     Plan:     Initial labs drawn. Prenatal vitamins. Problem list reviewed and updated. Reviewed in detail the nature of the practice with collaborative care between  Genetic screening discussed: NIPS- ordered. AFP next visit Role of ultrasound in pregnancy discussed; Anatomy US: ordered. Amniocentesis discussed: not indicated. Follow up in 4 weeks. Discussed clinic routines, schedule of care and testing, genetic screening options, involvement of students and residents under the direct supervision of APPs and doctors and presence of female providers. Pt verbalized understanding.  Return in about 4 weeks (around 07/16/2022) for Routine prenatal care, Mom+Baby Combined Care.  Future Appointments  Date Time Provider [place]  07/17/2022  8:55 AM Donnamae Jude, MD [facility]  07/23/2022  9:45 AM [facility] US5 [facility]    Caren Macadam, MD 06/18/2022 10:19 AM

## 2022-06-19 ENCOUNTER — Encounter: Payer: Self-pay | Admitting: *Deleted

## 2022-06-19 ENCOUNTER — Encounter: Payer: Self-pay | Admitting: Family Medicine

## 2022-06-19 LAB — CBC/D/PLT+RPR+RH+ABO+RUBIGG...
Antibody Screen: NEGATIVE
Basophils Absolute: 0 10*3/uL (ref 0.0–0.2)
Basos: 1 %
EOS (ABSOLUTE): 0.1 10*3/uL (ref 0.0–0.4)
Eos: 1 %
HCV Ab: NONREACTIVE
HIV Screen 4th Generation wRfx: NONREACTIVE
Hematocrit: 36.6 % (ref 34.0–46.6)
Hemoglobin: 12.2 g/dL (ref 11.1–15.9)
Hepatitis B Surface Ag: NEGATIVE
Immature Grans (Abs): 0.1 10*3/uL (ref 0.0–0.1)
Immature Granulocytes: 1 %
Lymphocytes Absolute: 2.2 10*3/uL (ref 0.7–3.1)
Lymphs: 34 %
MCH: 29.3 pg (ref 26.6–33.0)
MCHC: 33.3 g/dL (ref 31.5–35.7)
MCV: 88 fL (ref 79–97)
Monocytes Absolute: 0.4 10*3/uL (ref 0.1–0.9)
Monocytes: 6 %
Neutrophils Absolute: 3.7 10*3/uL (ref 1.4–7.0)
Neutrophils: 57 %
Platelets: 214 10*3/uL (ref 150–450)
RBC: 4.17 x10E6/uL (ref 3.77–5.28)
RDW: 12.9 % (ref 11.7–15.4)
RPR Ser Ql: NONREACTIVE
Rh Factor: POSITIVE
Rubella Antibodies, IGG: 1.69 index (ref >0.99)
WBC: 6.5 10*3/uL (ref 3.4–10.8)

## 2022-06-19 LAB — GC/CHLAMYDIA PROBE AMP (~~LOC~~) NOT AT ARMC
Chlamydia: NEGATIVE
Comment: NEGATIVE
Comment: NORMAL
Neisseria Gonorrhea: NEGATIVE

## 2022-06-19 LAB — HEMOGLOBIN A1C
Est. average glucose Bld gHb Est-mCnc: 100 mg/dL
Hgb A1c MFr Bld: 5.1 % (ref 4.8–5.6)

## 2022-06-19 LAB — HCV INTERPRETATION

## 2022-06-20 LAB — CULTURE, OB URINE

## 2022-06-20 LAB — URINE CULTURE, OB REFLEX

## 2022-06-21 ENCOUNTER — Encounter: Payer: Self-pay | Admitting: Certified Nurse Midwife

## 2022-06-26 LAB — PANORAMA PRENATAL TEST FULL PANEL:PANORAMA TEST PLUS 5 ADDITIONAL MICRODELETIONS: FETAL FRACTION: 11

## 2022-07-16 NOTE — L&D Delivery Note (Signed)
OB/GYN Faculty Practice Delivery Note  Rebecca Church is a 27 y.o. G1P0000 s/p SVD at [redacted]w[redacted]d. She was admitted for PROM.   ROM: 28h 70m with clear fluid GBS Status:  Positive/-- (05/10 1134) Maximum Maternal Temperature: afebrile  Labor Progress: Initial SVE: 1.5cm. She then progressed to complete.   Delivery Date/Time:  Delivery: Called to room and patient was complete and pushing. Head delivered LOA. No nuchal cord present. Shoulder and body delivered in usual fashion. Infant with spontaneous cry, placed on mother's abdomen, dried and stimulated. Cord clamped x 2 after 1-minute delay, and cut by FOB. Cord blood drawn. Placenta delivered spontaneously with gentle cord traction. Fundus firm with massage and Pitocin. Labia, perineum, vagina, and cervix inspected inspected with 1st degree laceration.  Baby Weight: pending  Placenta: Sent to L&D Complications: None Lacerations: 1st degree repaired with 3-0 monocryl EBL: 143 mL Analgesia: Epidural   Infant:  APGAR (1 MIN):  9 APGAR (5 MINS):  9 APGAR (10 MINS):     Rebecca Heritage, DO [facility] 12/09/2022, 12:25 PM

## 2022-07-17 ENCOUNTER — Ambulatory Visit (INDEPENDENT_AMBULATORY_CARE_PROVIDER_SITE_OTHER): Payer: Medicaid Other | Admitting: Family Medicine

## 2022-07-17 ENCOUNTER — Encounter: Payer: Self-pay | Admitting: Family Medicine

## 2022-07-17 VITALS — BP 110/79 | HR 106 | Wt 202.0 lb

## 2022-07-17 DIAGNOSIS — Z3A18 18 weeks gestation of pregnancy: Secondary | ICD-10-CM

## 2022-07-17 DIAGNOSIS — Z3482 Encounter for supervision of other normal pregnancy, second trimester: Secondary | ICD-10-CM

## 2022-07-17 DIAGNOSIS — Z3480 Encounter for supervision of other normal pregnancy, unspecified trimester: Secondary | ICD-10-CM

## 2022-07-17 NOTE — Progress Notes (Signed)
   PRENATAL VISIT NOTE  Subjective:  Rebecca Church is a 27 y.o. G1P0000 at [redacted]w[redacted]d being seen today for ongoing prenatal care.  She is currently monitored for the following issues for this low-risk pregnancy and has Supervision of other normal pregnancy, antepartum on their problem list.  Patient reports no complaints.  Contractions: Not present. Vag. Bleeding: None.  Movement: Present. Denies leaking of fluid.   The following portions of the patient's history were reviewed and updated as appropriate: allergies, current medications, past family history, past medical history, past social history, past surgical history and problem list.   Objective:   Vitals:   07/17/22 0906  BP: 110/79  Pulse: (!) 106  Weight: 202 lb (91.6 kg)    Fetal Status: Fetal Heart Rate (bpm): 164   Movement: Present     General:  Alert, oriented and cooperative. Patient is in no acute distress.  Skin: Skin is warm and dry. No rash noted.   Cardiovascular: Normal heart rate noted  Respiratory: Normal respiratory effort, no problems with respiration noted  Abdomen: Soft, gravid, appropriate for gestational age.  Pain/Pressure: Absent     Pelvic: Cervical exam deferred        Extremities: Normal range of motion.     Mental Status: Normal mood and affect. Normal behavior. Normal judgment and thought content.   Assessment and Plan:  Pregnancy: G1P0000 at [redacted]w[redacted]d 1. Supervision of other normal pregnancy, antepartum Continue routine prenatal care. Anatomy u/s on 07/23/2022 AFP today - AFP, Serum, Open Spina Bifida  General obstetric precautions including but not limited to vaginal bleeding, contractions, leaking of fluid and fetal movement were reviewed in detail with the patient. Please refer to After Visit Summary for other counseling recommendations.   Return in 4 weeks (on 08/14/2022).  Future Appointments  Date Time Provider Department Center  07/23/2022  9:45 AM [facility] US5 [facility]  08/20/2022  9:55 AM  Caren Macadam, MD [facility]    Donnamae Jude, MD

## 2022-07-20 LAB — AFP, SERUM, OPEN SPINA BIFIDA
AFP MoM: 1.21
AFP Value: 49.3 ng/mL
Gest. Age on Collection Date: 18.1 weeks
Maternal Age At EDD: 26.9 yr
OSBR Risk 1 IN: 10000
Test Results:: NEGATIVE
Weight: 202 [lb_av]

## 2022-07-23 ENCOUNTER — Ambulatory Visit: Payer: Medicaid Other | Attending: Obstetrics

## 2022-07-23 ENCOUNTER — Other Ambulatory Visit: Payer: Self-pay | Admitting: Family Medicine

## 2022-07-23 ENCOUNTER — Other Ambulatory Visit: Payer: Self-pay | Admitting: *Deleted

## 2022-07-23 DIAGNOSIS — O4442 Low lying placenta NOS or without hemorrhage, second trimester: Secondary | ICD-10-CM

## 2022-07-23 DIAGNOSIS — Z3A19 19 weeks gestation of pregnancy: Secondary | ICD-10-CM | POA: Insufficient documentation

## 2022-07-23 DIAGNOSIS — Z363 Encounter for antenatal screening for malformations: Secondary | ICD-10-CM | POA: Insufficient documentation

## 2022-07-23 DIAGNOSIS — Z3480 Encounter for supervision of other normal pregnancy, unspecified trimester: Secondary | ICD-10-CM | POA: Insufficient documentation

## 2022-07-26 ENCOUNTER — Encounter: Payer: Self-pay | Admitting: General Practice

## 2022-07-26 LAB — HORIZON CUSTOM: REPORT SUMMARY: NEGATIVE

## 2022-08-20 ENCOUNTER — Encounter: Payer: Medicaid Other | Admitting: Family Medicine

## 2022-08-20 ENCOUNTER — Ambulatory Visit (INDEPENDENT_AMBULATORY_CARE_PROVIDER_SITE_OTHER): Payer: Medicaid Other | Admitting: Family Medicine

## 2022-08-20 VITALS — BP 109/75 | HR 79 | Wt 210.4 lb

## 2022-08-20 DIAGNOSIS — Z3480 Encounter for supervision of other normal pregnancy, unspecified trimester: Secondary | ICD-10-CM

## 2022-08-20 NOTE — Patient Instructions (Signed)
Rebecca Church has many resources to help you plan for labor and support your pregnancy and parenting.  ? ?You are able to sign up for these courses through the website. Courses are offered evenings and weekends to increase your ability to attend. They are designed to low or no cost.  ? ?Here is the website: ?[email or web address]  ? ? ?

## 2022-08-20 NOTE — Progress Notes (Signed)
   PRENATAL VISIT NOTE  Subjective:  Rebecca Church is a 27 y.o. G1P0000 at [redacted]w[redacted]d being seen today for ongoing prenatal care.  She is currently monitored for the following issues for this low-risk pregnancy and has Supervision of other normal pregnancy, antepartum on their problem list.  Patient reports no complaints.  Contractions: Not present. Vag. Bleeding: None.  Movement: Present. Denies leaking of fluid.   The following portions of the patient's history were reviewed and updated as appropriate: allergies, current medications, past family history, past medical history, past social history, past surgical history and problem list.   Objective:   Vitals:   08/20/22 1015  BP: 109/75  Pulse: 79  Weight: 210 lb 6.4 oz (95.4 kg)    Fetal Status: Fetal Heart Rate (bpm): 152 Fundal Height: 23 cm Movement: Present     General:  Alert, oriented and cooperative. Patient is in no acute distress.  Skin: Skin is warm and dry. No rash noted.   Cardiovascular: Normal heart rate noted  Respiratory: Normal respiratory effort, no problems with respiration noted  Abdomen: Soft, gravid, appropriate for gestational age.  Pain/Pressure: Absent     Pelvic: Cervical exam deferred        Extremities: Normal range of motion.     Mental Status: Normal mood and affect. Normal behavior. Normal judgment and thought content.   Assessment and Plan:  Pregnancy: G1P0000 at [redacted]w[redacted]d 1. Supervision of other normal pregnancy, antepartum Up to date Doing well Feeling movement No concerns today Referred for Doula Services She is thinking about unmedicated birth, encouraged CB Education class  Preterm labor symptoms and general obstetric precautions including but not limited to vaginal bleeding, contractions, leaking of fluid and fetal movement were reviewed in detail with the patient. Please refer to After Visit Summary for other counseling recommendations.   Return in about 4 weeks (around 09/17/2022) for Mom+Baby  Combined Care.  Future Appointments  Date Time Provider [place]  08/27/2022  8:30 AM [facility] NURSE [facility]  08/27/2022  8:45 AM [facility] US4 [facility]  09/17/2022  8:50 AM [facility] LAB [facility]  09/17/2022  8:55 AM Caren Macadam, MD [facility]  10/05/2022  9:15 AM Clarnce Flock, MD [facility]    Caren Macadam, MD

## 2022-08-27 ENCOUNTER — Other Ambulatory Visit: Payer: Self-pay | Admitting: *Deleted

## 2022-08-27 ENCOUNTER — Ambulatory Visit: Payer: Medicaid Other | Admitting: *Deleted

## 2022-08-27 ENCOUNTER — Ambulatory Visit: Payer: Medicaid Other | Attending: Maternal & Fetal Medicine

## 2022-08-27 VITALS — BP 104/57 | HR 74

## 2022-08-27 DIAGNOSIS — O35EXX0 Maternal care for other (suspected) fetal abnormality and damage, fetal genitourinary anomalies, not applicable or unspecified: Secondary | ICD-10-CM

## 2022-08-27 DIAGNOSIS — Z362 Encounter for other antenatal screening follow-up: Secondary | ICD-10-CM

## 2022-08-27 DIAGNOSIS — O4442 Low lying placenta NOS or without hemorrhage, second trimester: Secondary | ICD-10-CM

## 2022-08-27 DIAGNOSIS — Z3A24 24 weeks gestation of pregnancy: Secondary | ICD-10-CM

## 2022-09-14 ENCOUNTER — Encounter: Payer: Self-pay | Admitting: Family Medicine

## 2022-09-17 ENCOUNTER — Ambulatory Visit (INDEPENDENT_AMBULATORY_CARE_PROVIDER_SITE_OTHER): Payer: Medicaid Other | Admitting: Family Medicine

## 2022-09-17 ENCOUNTER — Encounter: Payer: Self-pay | Admitting: Family Medicine

## 2022-09-17 ENCOUNTER — Other Ambulatory Visit: Payer: Medicaid Other

## 2022-09-17 ENCOUNTER — Other Ambulatory Visit: Payer: Self-pay | Admitting: General Practice

## 2022-09-17 DIAGNOSIS — Z3480 Encounter for supervision of other normal pregnancy, unspecified trimester: Secondary | ICD-10-CM

## 2022-09-17 MED ORDER — FAMOTIDINE 20 MG PO TABS: 20.0000 mg | ORAL_TABLET | Freq: Two times a day (BID) | ORAL | 0 refills | Status: DC

## 2022-09-17 NOTE — Progress Notes (Signed)
   PRENATAL VISIT NOTE  Subjective:  Rebecca Church is a 27 y.o. G1P0000 at 35w0dbeing seen today for ongoing prenatal care.  She is currently monitored for the following issues for this low-risk pregnancy and has Supervision of other normal pregnancy, antepartum on their problem list.  Patient reports no complaints.  Contractions: Not present. Vag. Bleeding: None.  Movement: Present. Denies leaking of fluid.   The following portions of the patient's history were reviewed and updated as appropriate: allergies, current medications, past family history, past medical history, past social history, past surgical history and problem list.   Objective:  There were no vitals filed for this visit.  Fetal Status: Fetal Heart Rate (bpm): 140 Fundal Height: 28 cm Movement: Present     General:  Alert, oriented and cooperative. Patient is in no acute distress.  Skin: Skin is warm and dry. No rash noted.   Cardiovascular: Normal heart rate noted  Respiratory: Normal respiratory effort, no problems with respiration noted  Abdomen: Soft, gravid, appropriate for gestational age.  Pain/Pressure: Absent     Pelvic: Cervical exam deferred        Extremities: Normal range of motion.  Edema: None  Mental Status: Normal mood and affect. Normal behavior. Normal judgment and thought content.   Assessment and Plan:  Pregnancy: G1P0000 at 238w0d. Supervision of other normal pregnancy, antepartum Was supposed to get 2hr today but was not fasting Declined Tdap today Discussed breast pump and given Aeroflow Reviewed pregnancy safe medication  Preterm labor symptoms and general obstetric precautions including but not limited to vaginal bleeding, contractions, leaking of fluid and fetal movement were reviewed in detail with the patient. Please refer to After Visit Summary for other counseling recommendations.   Return in about 2 weeks (around 10/01/2022) for Mom+Baby Combined Care.  Future Appointments  Date  Time Provider [place]3/14/2024  8:20 AM [facility]/22/2024  9:15 AM [facility] Fl[facility]/02/2023  8:30 AM [facility]2023  8:45 AM [facility] US4 [facility] [place]  KiCaren MacadamMD

## 2022-09-17 NOTE — Patient Instructions (Signed)

## 2022-09-27 ENCOUNTER — Other Ambulatory Visit: Payer: Medicaid Other

## 2022-09-27 ENCOUNTER — Other Ambulatory Visit: Payer: Self-pay

## 2022-09-27 ENCOUNTER — Telehealth: Payer: Self-pay | Admitting: Family Medicine

## 2022-09-27 ENCOUNTER — Other Ambulatory Visit: Payer: 59

## 2022-09-27 DIAGNOSIS — Z3480 Encounter for supervision of other normal pregnancy, unspecified trimester: Secondary | ICD-10-CM

## 2022-09-27 NOTE — Telephone Encounter (Signed)
Patient is requesting her breast pump forms be fax over plz, would like a call back.

## 2022-09-28 LAB — CBC
Hematocrit: 37.8 % (ref 34.0–46.6)
Hemoglobin: 12 g/dL (ref 11.1–15.9)
MCH: 28.3 pg (ref 26.6–33.0)
MCHC: 31.7 g/dL (ref 31.5–35.7)
MCV: 89 fL (ref 79–97)
Platelets: 229 10*3/uL (ref 150–450)
RBC: 4.24 x10E6/uL (ref 3.77–5.28)
RDW: 13 % (ref 11.7–15.4)
WBC: 8.2 10*3/uL (ref 3.4–10.8)

## 2022-09-28 LAB — RPR: RPR Ser Ql: NONREACTIVE

## 2022-09-28 LAB — GLUCOSE TOLERANCE, 2 HOURS W/ 1HR
Glucose, 1 hour: 68 mg/dL — ABNORMAL LOW (ref 70–179)
Glucose, 2 hour: 94 mg/dL (ref 70–152)
Glucose, Fasting: 83 mg/dL (ref 70–91)

## 2022-09-28 LAB — HIV ANTIBODY (ROUTINE TESTING W REFLEX): HIV Screen 4th Generation wRfx: NONREACTIVE

## 2022-10-05 ENCOUNTER — Encounter: Payer: Self-pay | Admitting: Family Medicine

## 2022-10-05 ENCOUNTER — Ambulatory Visit (INDEPENDENT_AMBULATORY_CARE_PROVIDER_SITE_OTHER): Payer: 59 | Admitting: Family Medicine

## 2022-10-05 VITALS — BP 103/69 | HR 84 | Wt 223.0 lb

## 2022-10-05 DIAGNOSIS — Z3480 Encounter for supervision of other normal pregnancy, unspecified trimester: Secondary | ICD-10-CM

## 2022-10-05 NOTE — Progress Notes (Signed)
   Subjective:  Rebecca Church is a 27 y.o. G1P0000 at [redacted]w[redacted]d being seen today for ongoing prenatal care.  She is currently monitored for the following issues for this low-risk pregnancy and has Supervision of other normal pregnancy, antepartum on their problem list.  Patient reports no complaints.  Contractions: Not present. Vag. Bleeding: None.  Movement: Present. Denies leaking of fluid.   The following portions of the patient's history were reviewed and updated as appropriate: allergies, current medications, past family history, past medical history, past social history, past surgical history and problem list. Problem list updated.  Objective:   Vitals:   10/05/22 0957  BP: 103/69  Pulse: 84  Weight: 223 lb (101.2 kg)    Fetal Status: Fetal Heart Rate (bpm): 135   Movement: Present     General:  Alert, oriented and cooperative. Patient is in no acute distress.  Skin: Skin is warm and dry. No rash noted.   Cardiovascular: Normal heart rate noted  Respiratory: Normal respiratory effort, no problems with respiration noted  Abdomen: Soft, gravid, appropriate for gestational age. Pain/Pressure: Absent     Pelvic: Vag. Bleeding: None     Cervical exam deferred        Extremities: Normal range of motion.     Mental Status: Normal mood and affect. Normal behavior. Normal judgment and thought content.   Urinalysis:      Assessment and Plan:  Pregnancy: G1P0000 at [redacted]w[redacted]d  1. Supervision of other normal pregnancy, antepartum BP and FHR normal FH appropriate Reminded of upcoming anatomy scan  Preterm labor symptoms and general obstetric precautions including but not limited to vaginal bleeding, contractions, leaking of fluid and fetal movement were reviewed in detail with the patient. Please refer to After Visit Summary for other counseling recommendations.  Return in 2 weeks (on 10/19/2022) for Dyad patient, ob visit.   Clarnce Flock, MD

## 2022-10-05 NOTE — Patient Instructions (Signed)

## 2022-10-12 ENCOUNTER — Inpatient Hospital Stay (HOSPITAL_COMMUNITY)
Admission: AD | Admit: 2022-10-12 | Discharge: 2022-10-12 | Disposition: A | Discharge status: Home / Self Care | Payer: 59 | Attending: Family Medicine | Admitting: Family Medicine

## 2022-10-12 ENCOUNTER — Other Ambulatory Visit: Payer: Self-pay

## 2022-10-12 ENCOUNTER — Encounter (HOSPITAL_COMMUNITY): Payer: Self-pay | Admitting: Family Medicine

## 2022-10-12 ENCOUNTER — Ambulatory Visit (HOSPITAL_COMMUNITY)
Admission: EM | Admit: 2022-10-12 | Discharge: 2022-10-12 | Disposition: A | Discharge status: Home / Self Care | Payer: 59

## 2022-10-12 ENCOUNTER — Encounter (HOSPITAL_COMMUNITY): Payer: Self-pay

## 2022-10-12 DIAGNOSIS — O4693 Antepartum hemorrhage, unspecified, third trimester: Secondary | ICD-10-CM

## 2022-10-12 DIAGNOSIS — Z3A30 30 weeks gestation of pregnancy: Secondary | ICD-10-CM | POA: Diagnosis not present

## 2022-10-12 LAB — URINALYSIS, ROUTINE W REFLEX MICROSCOPIC
Bilirubin Urine: NEGATIVE
Glucose, UA: NEGATIVE mg/dL
Ketones, ur: NEGATIVE mg/dL
Leukocytes,Ua: NEGATIVE
Nitrite: NEGATIVE
Protein, ur: NEGATIVE mg/dL
Specific Gravity, Urine: 1.019 (ref 1.005–1.030)
pH: 7 (ref 5.0–8.0)

## 2022-10-12 LAB — WET PREP, GENITAL
Clue Cells Wet Prep HPF POC: NONE SEEN
Sperm: NONE SEEN
Trich, Wet Prep: NONE SEEN
WBC, Wet Prep HPF POC: <10 (ref <10)
Yeast Wet Prep HPF POC: NONE SEEN

## 2022-10-12 NOTE — MAU Provider Note (Signed)
Chief Complaint:  Vaginal Bleeding   Event Date/Time   First Provider Initiated Contact with Patient 10/12/22 1452      HPI: Rebecca Church is a 27 y.o. G1P0000 at [redacted]w[redacted]d by LMP who presents to maternity admissions reporting light red/pink bleeding this morning that has remained light but turned more brown over course of the day.  She denies any cramping or pain.  There is still scan brown when wiping in MAU.  There are no other s/sx. She is feeling normal fetal movement. Last intercourse was 4 days ago and she denies any changes in activity recently.   HPI  Past Medical History: Past Medical History:  Diagnosis Date   Gonorrhea 02/02/2019   Medical history non-contributory     Past obstetric history: OB History  Gravida Para Term Preterm AB Living  1 0 0 0 0 0  SAB IAB Ectopic Multiple Live Births  0 0 0 0 0    # Outcome Date GA Lbr Len/2nd Weight Sex Delivery Anes PTL Lv  1 Current             Past Surgical History: Past Surgical History:  Procedure Laterality Date   NO PAST SURGERIES      Family History: Family History  Problem Relation Age of Onset   Healthy Mother     Social History: Social History   Tobacco Use   Smoking status: Never   Smokeless tobacco: Never  Vaping Use   Vaping Use: Never used  Substance Use Topics   Alcohol use: No    Comment: occasionally   Drug use: No    Allergies: No Known Allergies  Meds:  Medications Prior to Admission  Medication Sig Dispense Refill Last Dose   famotidine (PEPCID) 20 MG tablet Take 1 tablet (20 mg total) by mouth 2 (two) times daily. 60 tablet 0 Past Month   prenatal vitamin w/FE, FA (NATACHEW) 29-1 MG CHEW chewable tablet Chew 1 tablet by mouth daily at 12 noon. 30 tablet 11 Past Week    ROS:  Review of Systems   I have reviewed patient's Past Medical Hx, Surgical Hx, Family Hx, Social Hx, medications and allergies.   Physical Exam  Patient Vitals for the past 24 hrs:  BP Temp Temp src Pulse Resp  SpO2 Height Weight  10/12/22 1427 110/64 -- -- 86 18 100 % -- --  10/12/22 1414 (!) 117/59 98.5 F (36.9 C) Oral 91 20 100 % -- --  10/12/22 1407 -- -- -- -- -- -- 5\' 11"  (1.803 m) 102 kg   Constitutional: Well-developed, well-nourished female in no acute distress.  Cardiovascular: normal rate Respiratory: normal effort GI: Abd soft, non-tender, gravid appropriate for gestational age.  MS: Extremities nontender, no edema, normal ROM Neurologic: Alert and oriented x 4.  GU: Neg CVAT.  PELVIC EXAM: Cervix pink, visually closed, without lesion, scant white creamy discharge, vaginal walls and external genitalia normal Bimanual exam: Cervix 0/long/high, firm, anterior, neg CMT, uterus nontender, nonenlarged, adnexa without tenderness, enlargement, or mass  Dilation: Closed Effacement (%): Thick Exam by:: Rex Kras, CNM  FHT:  Baseline 135 , moderate variability, accelerations present, no decelerations Contractions: rare, mild to palpation   Labs: Results for orders placed or performed during the hospital encounter of 10/12/22 (from the past 24 hour(s))  Urinalysis, Routine w reflex microscopic -Urine, Clean Catch     Status: Abnormal   Collection Time: 10/12/22  2:20 PM  Result Value Ref Range   Color, Urine YELLOW YELLOW  APPearance CLEAR CLEAR   Specific Gravity, Urine 1.019 1.005 - 1.030   pH 7.0 5.0 - 8.0   Glucose, UA NEGATIVE NEGATIVE mg/dL   Hgb urine dipstick SMALL (A) NEGATIVE   Bilirubin Urine NEGATIVE NEGATIVE   Ketones, ur NEGATIVE NEGATIVE mg/dL   Protein, ur NEGATIVE NEGATIVE mg/dL   Nitrite NEGATIVE NEGATIVE   Leukocytes,Ua NEGATIVE NEGATIVE   RBC / HPF 0-5 0 - 5 RBC/hpf   WBC, UA 0-5 0 - 5 WBC/hpf   Bacteria, UA RARE (A) NONE SEEN   Squamous Epithelial / HPF 0-5 0 - 5 /HPF   Mucus PRESENT   Wet prep, genital     Status: None   Collection Time: 10/12/22  3:07 PM  Result Value Ref Range   Yeast Wet Prep HPF POC NONE SEEN NONE SEEN   Trich, Wet Prep NONE SEEN  NONE SEEN   Clue Cells Wet Prep HPF POC NONE SEEN NONE SEEN   WBC, Wet Prep HPF POC <10 <10   Sperm NONE SEEN    O/Positive/-- (12/04 1020)  Imaging:  No results found.  MAU Course/MDM: Orders Placed This Encounter  Procedures   Wet prep, genital   Urinalysis, Routine w reflex microscopic -Urine, Clean Catch   Discharge patient    No orders of the defined types were placed in this encounter.    NST reviewed and appropriate for gestational age Cervix closed/thick/high, no evidence of preterm labor Wet prep negative, UA negative except for small amount of blood, and GCC pending Consult Dr Rip Harbour with presentation, exam findings and test results.  Given scan bleeding and otherwise normal testing, D/C home with bleeding precautions, pt to return with any changes Keep scheduled appts in the office     Assessment: 1. Supervision of other normal pregnancy, antepartum   2. Vaginal bleeding in pregnancy, third trimester   3. [redacted] weeks gestation of pregnancy     Plan: Discharge home Labor precautions and fetal kick counts  Follow-up Information     [facility] at [facility] Follow up.   Specialty: Family Medicine Why: As scheduled Contact information: [address] [place] [phone] [place] [facility] Follow up.   Specialty: Obstetrics and Gynecology Contact information: [address] [id number] [place] [phone]               Allergies as of 10/12/2022   No Known Allergies      Medication List     TAKE these medications    famotidine 20 MG tablet Commonly known as: Pepcid Take 1 tablet (20 mg total) by mouth 2 (two) times daily.   prenatal vitamin w/FE, FA 29-1 MG Chew chewable tablet Chew 1 tablet by mouth daily at 12 noon.        Fatima Blank Certified Nurse-Midwife 10/12/2022 3:34 PM

## 2022-10-12 NOTE — Discharge Instructions (Addendum)
Please go to the maternity assessment unit at [facility].  It is important that you get this worked up today.

## 2022-10-12 NOTE — MAU Note (Signed)
Rebecca Church is a 27 y.o. at [redacted]w[redacted]d here in MAU reporting: she noticed light VB with wiping earlier but now it's brown.  Reports last intercourse Monday.  Denies LOF and cramping/pain.  Endorses +FM.   LMP: NA Onset of complaint: today Pain score: 0 Vitals:   10/12/22 1414  BP: (!) 117/59  Pulse: 91  Resp: 20  Temp: 98.5 F (36.9 C)  SpO2: 100%     FHT:143 bpm Lab orders placed from triage:  UA

## 2022-10-12 NOTE — ED Provider Notes (Signed)
Patient who is [redacted] weeks pregnant presents to urgent care for evaluation of painless light pink vaginal bleeding that she first noticed 1 hour ago.  She also noticed some dark brown blood shortly after noticing the light pink blood on her underwear.  She has never experienced this in her pregnancy.  She has been receiving prenatal care for this pregnancy.  Denies decreased fetal movement, abdominal pain, back pain, urinary symptoms, and fever/chills.  No nausea or vomiting.  Denies gross hematuria.   Advised to go to the maternity assessment unit for further workup and evaluation due to vaginal bleeding in pregnancy.  Patient is agreeable with plan.  Discussed risks of deferring visit to the MAU to which she further expresses understanding and agreement with plan.  Vital signs are stable in clinic.  May go to the maternity assessment unit by personal vehicle.   Talbot Grumbling, [place] 10/12/22 1315

## 2022-10-12 NOTE — ED Triage Notes (Signed)
Patient states approx one hour ago she experienced a light red vaginal bleeding then it became dark brown. Patient is currently [redacted] weeks pregnant. Patient denies any contractions or pain.

## 2022-10-15 ENCOUNTER — Encounter: Payer: Medicaid Other | Admitting: Family Medicine

## 2022-10-15 LAB — GC/CHLAMYDIA PROBE AMP (~~LOC~~) NOT AT ARMC
Chlamydia: NEGATIVE
Comment: NEGATIVE
Comment: NORMAL
Neisseria Gonorrhea: NEGATIVE

## 2022-10-19 ENCOUNTER — Encounter: Payer: Self-pay | Admitting: Family Medicine

## 2022-10-19 ENCOUNTER — Ambulatory Visit (INDEPENDENT_AMBULATORY_CARE_PROVIDER_SITE_OTHER): Payer: 59 | Admitting: Family Medicine

## 2022-10-19 ENCOUNTER — Other Ambulatory Visit: Payer: Self-pay

## 2022-10-19 VITALS — BP 100/69 | HR 88 | Wt 227.8 lb

## 2022-10-19 DIAGNOSIS — Z3480 Encounter for supervision of other normal pregnancy, unspecified trimester: Secondary | ICD-10-CM

## 2022-10-19 NOTE — Patient Instructions (Signed)

## 2022-10-19 NOTE — Progress Notes (Signed)
   Subjective:  Rebecca Church is a 27 y.o. G1P0000 at [redacted]w[redacted]d being seen today for ongoing prenatal care.  She is currently monitored for the following issues for this low-risk pregnancy and has Supervision of other normal pregnancy, antepartum on their problem list.  Patient reports no complaints.  Contractions: Not present. Vag. Bleeding: None.  Movement: Present. Denies leaking of fluid.   The following portions of the patient's history were reviewed and updated as appropriate: allergies, current medications, past family history, past medical history, past social history, past surgical history and problem list. Problem list updated.  Objective:   Vitals:   10/19/22 0858  BP: 100/69  Pulse: 88  Weight: 227 lb 12.8 oz (103.3 kg)    Fetal Status: Fetal Heart Rate (bpm): 148 Fundal Height: 31 cm Movement: Present     General:  Alert, oriented and cooperative. Patient is in no acute distress.  Skin: Skin is warm and dry. No rash noted.   Cardiovascular: Normal heart rate noted  Respiratory: Normal respiratory effort, no problems with respiration noted  Abdomen: Soft, gravid, appropriate for gestational age. Pain/Pressure: Present     Pelvic: Vag. Bleeding: None     Cervical exam deferred        Extremities: Normal range of motion.     Mental Status: Normal mood and affect. Normal behavior. Normal judgment and thought content.   Urinalysis:      Assessment and Plan:  Pregnancy: G1P0000 at [redacted]w[redacted]d  1. Supervision of other normal pregnancy, antepartum BP and FHR normal Has growth [place] next week to follow up on mild renal dilation (likely normal variant per MFM)  Preterm labor symptoms and general obstetric precautions including but not limited to vaginal bleeding, contractions, leaking of fluid and fetal movement were reviewed in detail with the patient. Please refer to After Visit Summary for other counseling recommendations.  Return in 2 weeks (on 11/02/2022) for Dyad patient, ob  visit.   Venora Maples, MD

## 2022-10-22 ENCOUNTER — Ambulatory Visit: Payer: 59

## 2022-10-26 ENCOUNTER — Ambulatory Visit (HOSPITAL_BASED_OUTPATIENT_CLINIC_OR_DEPARTMENT_OTHER): Payer: 59

## 2022-10-26 ENCOUNTER — Ambulatory Visit: Payer: 59 | Attending: Obstetrics and Gynecology | Admitting: *Deleted

## 2022-10-26 VITALS — BP 109/63 | HR 97

## 2022-10-26 DIAGNOSIS — Z3A32 32 weeks gestation of pregnancy: Secondary | ICD-10-CM

## 2022-10-26 DIAGNOSIS — Z362 Encounter for other antenatal screening follow-up: Secondary | ICD-10-CM | POA: Insufficient documentation

## 2022-10-26 DIAGNOSIS — O35EXX0 Maternal care for other (suspected) fetal abnormality and damage, fetal genitourinary anomalies, not applicable or unspecified: Secondary | ICD-10-CM | POA: Diagnosis not present

## 2022-10-26 DIAGNOSIS — O283 Abnormal ultrasonic finding on antenatal screening of mother: Secondary | ICD-10-CM | POA: Diagnosis not present

## 2022-10-30 ENCOUNTER — Encounter: Payer: Self-pay | Admitting: Family Medicine

## 2022-11-09 ENCOUNTER — Other Ambulatory Visit: Payer: Self-pay

## 2022-11-09 ENCOUNTER — Ambulatory Visit (INDEPENDENT_AMBULATORY_CARE_PROVIDER_SITE_OTHER): Payer: 59 | Admitting: Family Medicine

## 2022-11-09 VITALS — BP 107/72 | HR 95 | Wt 234.2 lb

## 2022-11-09 DIAGNOSIS — Z0289 Encounter for other administrative examinations: Secondary | ICD-10-CM

## 2022-11-09 DIAGNOSIS — Z3480 Encounter for supervision of other normal pregnancy, unspecified trimester: Secondary | ICD-10-CM

## 2022-11-09 NOTE — Progress Notes (Signed)
   PRENATAL VISIT NOTE  Subjective:  Rebecca Church is a 27 y.o. G1P0000 at [redacted]w[redacted]d being seen today for ongoing prenatal care.  She is currently monitored for the following issues for this low-risk pregnancy and has Supervision of other normal pregnancy, antepartum on their problem list.  Patient reports no complaints.  Contractions: Not present. Vag. Bleeding: None.  Movement: Present. Denies leaking of fluid.   The following portions of the patient's history were reviewed and updated as appropriate: allergies, current medications, past family history, past medical history, past social history, past surgical history and problem list.   Objective:   Vitals:   11/09/22 1131  BP: 107/72  Pulse: 95  Weight: 234 lb 3.2 oz (106.2 kg)    Fetal Status: Fetal Heart Rate (bpm): 142 Fundal Height: 34 cm Movement: Present  Presentation: Vertex  General:  Alert, oriented and cooperative. Patient is in no acute distress.  Skin: Skin is warm and dry. No rash noted.   Cardiovascular: Normal heart rate noted  Respiratory: Normal respiratory effort, no problems with respiration noted  Abdomen: Soft, gravid, appropriate for gestational age.  Pain/Pressure: Absent     Pelvic: Cervical exam deferred        Extremities: Normal range of motion.     Mental Status: Normal mood and affect. Normal behavior. Normal judgment and thought content.   Assessment and Plan:  Pregnancy: G1P0000 at [redacted]w[redacted]d 1. Supervision of other normal pregnancy, antepartum Fh appropriate No concerns today Vigorous movement  Has FMLA paperwork and wanted to confirm it was sent. I recommended speaking to front.  Knows about swabs next visit  Preterm labor symptoms and general obstetric precautions including but not limited to vaginal bleeding, contractions, leaking of fluid and fetal movement were reviewed in detail with the patient. Please refer to After Visit Summary for other counseling recommendations.   No follow-ups on  file.  Future Appointments  Date Time Provider Department Center  11/23/2022 10:35 AM Venora Maples, MD [facility]  11/30/2022 10:15 AM Venora Maples, MD [facility]  12/07/2022  9:35 AM Venora Maples, MD [facility]  12/14/2022  9:15 AM Federico Flake, MD [facility]  12/17/2022  1:15 PM [facility]  12/17/2022  1:55 PM Federico Flake, MD [facility]    Federico Flake, MD

## 2022-11-21 DIAGNOSIS — Z3483 Encounter for supervision of other normal pregnancy, third trimester: Secondary | ICD-10-CM | POA: Diagnosis not present

## 2022-11-21 DIAGNOSIS — Z3482 Encounter for supervision of other normal pregnancy, second trimester: Secondary | ICD-10-CM | POA: Diagnosis not present

## 2022-11-23 ENCOUNTER — Other Ambulatory Visit: Payer: Self-pay

## 2022-11-23 ENCOUNTER — Encounter: Payer: Self-pay | Admitting: Family Medicine

## 2022-11-23 ENCOUNTER — Ambulatory Visit (INDEPENDENT_AMBULATORY_CARE_PROVIDER_SITE_OTHER): Payer: 59 | Admitting: Family Medicine

## 2022-11-23 ENCOUNTER — Other Ambulatory Visit (HOSPITAL_COMMUNITY)
Admission: RE | Admit: 2022-11-23 | Discharge: 2022-11-23 | Disposition: A | Discharge status: Home / Self Care | Payer: 59 | Source: Ambulatory Visit | Attending: Family Medicine | Admitting: Family Medicine

## 2022-11-23 VITALS — BP 107/74 | HR 98 | Wt 242.2 lb

## 2022-11-23 DIAGNOSIS — Z3480 Encounter for supervision of other normal pregnancy, unspecified trimester: Secondary | ICD-10-CM

## 2022-11-23 NOTE — Patient Instructions (Signed)

## 2022-11-23 NOTE — Progress Notes (Signed)
   Subjective:  Rebecca Church is a 27 y.o. G1P0000 at [redacted]w[redacted]d being seen today for ongoing prenatal care.  She is currently monitored for the following issues for this low-risk pregnancy and has Supervision of other normal pregnancy, antepartum on their problem list.  Patient reports no complaints.  Contractions: Not present. Vag. Bleeding: None.  Movement: Present. Denies leaking of fluid.   The following portions of the patient's history were reviewed and updated as appropriate: allergies, current medications, past family history, past medical history, past social history, past surgical history and problem list. Problem list updated.  Objective:   Vitals:   11/23/22 1047  BP: 107/74  Pulse: 98  Weight: 242 lb 3.2 oz (109.9 kg)    Fetal Status: Fetal Heart Rate (bpm): 139   Movement: Present  Presentation: Vertex (bedside US)  General:  Alert, oriented and cooperative. Patient is in no acute distress.  Skin: Skin is warm and dry. No rash noted.   Cardiovascular: Normal heart rate noted  Respiratory: Normal respiratory effort, no problems with respiration noted  Abdomen: Soft, gravid, appropriate for gestational age. Pain/Pressure: Absent     Pelvic: Vag. Bleeding: None     Cervical exam deferred        Extremities: Normal range of motion.  Edema: Trace  Mental Status: Normal mood and affect. Normal behavior. Normal judgment and thought content.   Urinalysis:      Assessment and Plan:  Pregnancy: G1P0000 at [redacted]w[redacted]d  1. Supervision of other normal pregnancy, antepartum BP and FHR normal Cultures collected Confirmed vertex on bedside US - Culture, beta strep (group b only) - GC/Chlamydia probe amp ([place])not at [facility]  Preterm labor symptoms and general obstetric precautions including but not limited to vaginal bleeding, contractions, leaking of fluid and fetal movement were reviewed in detail with the patient. Please refer to After Visit Summary for other counseling  recommendations.  Return in 1 week (on 11/30/2022) for Dyad patient, ob visit.   Venora Maples, MD

## 2022-11-26 ENCOUNTER — Encounter: Payer: Self-pay | Admitting: Family Medicine

## 2022-11-26 DIAGNOSIS — O99820 Streptococcus B carrier state complicating pregnancy: Secondary | ICD-10-CM | POA: Insufficient documentation

## 2022-11-26 LAB — GC/CHLAMYDIA PROBE AMP (~~LOC~~) NOT AT ARMC
Chlamydia: NEGATIVE
Comment: NEGATIVE
Comment: NORMAL
Neisseria Gonorrhea: NEGATIVE

## 2022-11-26 LAB — CULTURE, BETA STREP (GROUP B ONLY): Strep Gp B Culture: POSITIVE — AB

## 2022-11-29 DIAGNOSIS — Z6834 Body mass index (BMI) 34.0-34.9, adult: Secondary | ICD-10-CM | POA: Diagnosis not present

## 2022-11-29 DIAGNOSIS — K219 Gastro-esophageal reflux disease without esophagitis: Secondary | ICD-10-CM | POA: Diagnosis not present

## 2022-11-29 DIAGNOSIS — E669 Obesity, unspecified: Secondary | ICD-10-CM | POA: Diagnosis not present

## 2022-11-30 ENCOUNTER — Other Ambulatory Visit: Payer: Self-pay

## 2022-11-30 ENCOUNTER — Encounter: Payer: Self-pay | Admitting: Family Medicine

## 2022-11-30 ENCOUNTER — Ambulatory Visit (INDEPENDENT_AMBULATORY_CARE_PROVIDER_SITE_OTHER): Payer: 59 | Admitting: Family Medicine

## 2022-11-30 VITALS — BP 121/74 | HR 94 | Wt 245.0 lb

## 2022-11-30 DIAGNOSIS — O99820 Streptococcus B carrier state complicating pregnancy: Secondary | ICD-10-CM | POA: Diagnosis not present

## 2022-11-30 DIAGNOSIS — Z3A37 37 weeks gestation of pregnancy: Secondary | ICD-10-CM | POA: Diagnosis not present

## 2022-11-30 DIAGNOSIS — Z1332 Encounter for screening for maternal depression: Secondary | ICD-10-CM | POA: Diagnosis not present

## 2022-11-30 DIAGNOSIS — Z3480 Encounter for supervision of other normal pregnancy, unspecified trimester: Secondary | ICD-10-CM

## 2022-11-30 NOTE — Patient Instructions (Signed)

## 2022-11-30 NOTE — Progress Notes (Signed)
   Subjective:  Rebecca Church is a 27 y.o. G1P0000 at [redacted]w[redacted]d being seen today for ongoing prenatal care.  She is currently monitored for the following issues for this low-risk pregnancy and has Supervision of other normal pregnancy, antepartum and GBS (group B Streptococcus carrier), +RV culture, currently pregnant on their problem list.  Patient reports no complaints.  Contractions: Irritability. Vag. Bleeding: None.  Movement: Present. Denies leaking of fluid.   The following portions of the patient's history were reviewed and updated as appropriate: allergies, current medications, past family history, past medical history, past social history, past surgical history and problem list. Problem list updated.  Objective:   Vitals:   11/30/22 1036  BP: 121/74  Pulse: 94  Weight: 245 lb (111.1 kg)    Fetal Status: Fetal Heart Rate (bpm): 135   Movement: Present  Presentation: Vertex  General:  Alert, oriented and cooperative. Patient is in no acute distress.  Skin: Skin is warm and dry. No rash noted.   Cardiovascular: Normal heart rate noted  Respiratory: Normal respiratory effort, no problems with respiration noted  Abdomen: Soft, gravid, appropriate for gestational age. Pain/Pressure: Present     Pelvic: Vag. Bleeding: None     Cervical exam performed Dilation: Fingertip Effacement (%): Thick Station: -2  Extremities: Normal range of motion.     Mental Status: Normal mood and affect. Normal behavior. Normal judgment and thought content.   Urinalysis:      Assessment and Plan:  Pregnancy: G1P0000 at [redacted]w[redacted]d  1. Supervision of other normal pregnancy, antepartum BP and FHR normal Cervix still closed, baby vertex  2. GBS (group B Streptococcus carrier), +RV culture, currently pregnant Ppx in labor  Term labor symptoms and general obstetric precautions including but not limited to vaginal bleeding, contractions, leaking of fluid and fetal movement were reviewed in detail with the  patient. Please refer to After Visit Summary for other counseling recommendations.  Return in 1 week (on 12/07/2022) for Dyad patient, ob visit.   Venora Maples, MD

## 2022-12-07 ENCOUNTER — Ambulatory Visit (INDEPENDENT_AMBULATORY_CARE_PROVIDER_SITE_OTHER): Payer: 59 | Admitting: Family Medicine

## 2022-12-07 ENCOUNTER — Other Ambulatory Visit: Payer: Self-pay

## 2022-12-07 ENCOUNTER — Encounter: Payer: Self-pay | Admitting: Family Medicine

## 2022-12-07 ENCOUNTER — Other Ambulatory Visit (HOSPITAL_COMMUNITY)
Admission: RE | Admit: 2022-12-07 | Discharge: 2022-12-07 | Disposition: A | Discharge status: Home / Self Care | Payer: 59 | Source: Ambulatory Visit | Attending: Family Medicine | Admitting: Family Medicine

## 2022-12-07 VITALS — BP 126/74 | HR 97 | Wt 247.6 lb

## 2022-12-07 DIAGNOSIS — Z3A38 38 weeks gestation of pregnancy: Secondary | ICD-10-CM

## 2022-12-07 DIAGNOSIS — Z3480 Encounter for supervision of other normal pregnancy, unspecified trimester: Secondary | ICD-10-CM

## 2022-12-07 DIAGNOSIS — N898 Other specified noninflammatory disorders of vagina: Secondary | ICD-10-CM | POA: Diagnosis not present

## 2022-12-07 DIAGNOSIS — O99820 Streptococcus B carrier state complicating pregnancy: Secondary | ICD-10-CM

## 2022-12-07 NOTE — Progress Notes (Signed)
   Subjective:  Danijah Ave is a 27 y.o. G1P0000 at [redacted]w[redacted]d being seen today for ongoing prenatal care.  She is currently monitored for the following issues for this low-risk pregnancy and has Supervision of other normal pregnancy, antepartum and GBS (group B Streptococcus carrier), +RV culture, currently pregnant on their problem list.  Patient reports  possible leaking fluid .  Contractions: Not present. Vag. Bleeding: None.  Movement: Present.   The following portions of the patient's history were reviewed and updated as appropriate: allergies, current medications, past family history, past medical history, past social history, past surgical history and problem list. Problem list updated.  Objective:   Vitals:   12/07/22 0956  BP: 126/74  Pulse: 97  Weight: 247 lb 9.6 oz (112.3 kg)    Fetal Status: Fetal Heart Rate (bpm): 140   Movement: Present     General:  Alert, oriented and cooperative. Patient is in no acute distress.  Skin: Skin is warm and dry. No rash noted.   Cardiovascular: Normal heart rate noted  Respiratory: Normal respiratory effort, no problems with respiration noted  Abdomen: Soft, gravid, appropriate for gestational age. Pain/Pressure: Absent     Pelvic: Vag. Bleeding: None Vag D/C Character: Watery   Cervical exam deferred        Extremities: Normal range of motion.  Edema: Trace  Mental Status: Normal mood and affect. Normal behavior. Normal judgment and thought content.   Urinalysis:      Assessment and Plan:  Pregnancy: G1P0000 at [redacted]w[redacted]d  1. Supervision of other normal pregnancy, antepartum BP and FHR normal Reports no big gushes, but felt leaking of fluid and loss of mucous plug last night Also started to have bad period like cramps On speculum exam no pooling but some fluid, extensive review of slide with no ferning seen Discussed if leaking continues she should go to MAU for amnisure Will send vaginitis swab Discussed possible PD scenario, scheduled  for 41 wk IOL Form faxed, orders placed  2. GBS (group B Streptococcus carrier), +RV culture, currently pregnant Ppx in labor  Term labor symptoms and general obstetric precautions including but not limited to vaginal bleeding, contractions, leaking of fluid and fetal movement were reviewed in detail with the patient. Please refer to After Visit Summary for other counseling recommendations.  Return in 1 week (on 12/14/2022) for Dyad patient, ob visit.   Venora Maples, MD

## 2022-12-07 NOTE — Patient Instructions (Signed)

## 2022-12-08 ENCOUNTER — Encounter (HOSPITAL_COMMUNITY): Payer: Self-pay | Admitting: Family Medicine

## 2022-12-08 ENCOUNTER — Inpatient Hospital Stay (HOSPITAL_COMMUNITY)
Admission: AD | Admit: 2022-12-08 | Discharge: 2022-12-11 | DRG: 807 | Disposition: A | Discharge status: Home / Self Care | Payer: 59 | Attending: Family Medicine | Admitting: Family Medicine

## 2022-12-08 DIAGNOSIS — O480 Post-term pregnancy: Secondary | ICD-10-CM | POA: Diagnosis present

## 2022-12-08 DIAGNOSIS — Z23 Encounter for immunization: Secondary | ICD-10-CM | POA: Diagnosis not present

## 2022-12-08 DIAGNOSIS — Z3480 Encounter for supervision of other normal pregnancy, unspecified trimester: Secondary | ICD-10-CM

## 2022-12-08 DIAGNOSIS — E669 Obesity, unspecified: Secondary | ICD-10-CM | POA: Diagnosis not present

## 2022-12-08 DIAGNOSIS — O4202 Full-term premature rupture of membranes, onset of labor within 24 hours of rupture: Secondary | ICD-10-CM | POA: Diagnosis not present

## 2022-12-08 DIAGNOSIS — O99820 Streptococcus B carrier state complicating pregnancy: Secondary | ICD-10-CM | POA: Diagnosis not present

## 2022-12-08 DIAGNOSIS — O99824 Streptococcus B carrier state complicating childbirth: Secondary | ICD-10-CM | POA: Diagnosis not present

## 2022-12-08 DIAGNOSIS — O4292 Full-term premature rupture of membranes, unspecified as to length of time between rupture and onset of labor: Principal | ICD-10-CM | POA: Diagnosis present

## 2022-12-08 DIAGNOSIS — O99214 Obesity complicating childbirth: Secondary | ICD-10-CM | POA: Diagnosis not present

## 2022-12-08 DIAGNOSIS — Z3A38 38 weeks gestation of pregnancy: Secondary | ICD-10-CM

## 2022-12-08 LAB — TYPE AND SCREEN
ABO/RH(D): O POS
Antibody Screen: NEGATIVE

## 2022-12-08 LAB — CBC
HCT: 36.9 % (ref 36.0–46.0)
Hemoglobin: 11.7 g/dL — ABNORMAL LOW (ref 12.0–15.0)
MCH: 26.9 pg (ref 26.0–34.0)
MCHC: 31.7 g/dL (ref 30.0–36.0)
MCV: 84.8 fL (ref 80.0–100.0)
Platelets: 272 10*3/uL (ref 150–400)
RBC: 4.35 MIL/uL (ref 3.87–5.11)
RDW: 14.8 % (ref 11.5–15.5)
WBC: 10.1 10*3/uL (ref 4.0–10.5)
nRBC: 0 % (ref 0.0–0.2)

## 2022-12-08 LAB — POCT FERN TEST: POCT Fern Test: POSITIVE

## 2022-12-08 MED ORDER — LIDOCAINE HCL (PF) 1 % IJ SOLN
30.0000 mL | INTRAMUSCULAR | Status: AC | PRN
  Administered 2022-12-09: 30 mL via SUBCUTANEOUS
  Filled 2022-12-08: qty 30

## 2022-12-08 MED ORDER — ACETAMINOPHEN 325 MG PO TABS: 650.0000 mg | ORAL_TABLET | ORAL | Status: DC | PRN

## 2022-12-08 MED ORDER — LACTATED RINGERS IV SOLN
INTRAVENOUS | Status: DC
  Administered 2022-12-09: 125 mL/h via INTRAVENOUS

## 2022-12-08 MED ORDER — FENTANYL CITRATE (PF) 100 MCG/2ML IJ SOLN
50.0000 ug | INTRAMUSCULAR | Status: DC | PRN
  Administered 2022-12-09: 100 ug via INTRAVENOUS
  Filled 2022-12-08: qty 2

## 2022-12-08 MED ORDER — SOD CITRATE-CITRIC ACID 500-334 MG/5ML PO SOLN: 30.0000 mL | ORAL | Status: DC | PRN

## 2022-12-08 MED ORDER — LACTATED RINGERS IV SOLN: 500.0000 mL | INTRAVENOUS | Status: DC | PRN

## 2022-12-08 MED ORDER — PENICILLIN G POT IN DEXTROSE 60000 UNIT/ML IV SOLN
3.0000 10*6.[IU] | INTRAVENOUS | Status: DC
  Administered 2022-12-08 – 2022-12-09 (×4): 3 10*6.[IU] via INTRAVENOUS
  Filled 2022-12-08 (×5): qty 50

## 2022-12-08 MED ORDER — OXYTOCIN BOLUS FROM INFUSION
333.0000 mL | Freq: Once | INTRAVENOUS | Status: AC
  Administered 2022-12-09: 333 mL via INTRAVENOUS

## 2022-12-08 MED ORDER — ONDANSETRON HCL 4 MG/2ML IJ SOLN
4.0000 mg | Freq: Four times a day (QID) | INTRAMUSCULAR | Status: DC | PRN
  Administered 2022-12-08 – 2022-12-09 (×3): 4 mg via INTRAVENOUS
  Filled 2022-12-08 (×3): qty 2

## 2022-12-08 MED ORDER — SODIUM CHLORIDE 0.9 % IV SOLN
5.0000 10*6.[IU] | Freq: Once | INTRAVENOUS | Status: AC
  Administered 2022-12-08: 5 10*6.[IU] via INTRAVENOUS
  Filled 2022-12-08: qty 5

## 2022-12-08 MED ORDER — OXYTOCIN-SODIUM CHLORIDE 30-0.9 UT/500ML-% IV SOLN
2.5000 [IU]/h | INTRAVENOUS | Status: DC
  Administered 2022-12-09: 2.5 [IU]/h via INTRAVENOUS

## 2022-12-08 NOTE — MAU Note (Signed)
   .  Ciyanna Osterloh is a 27 y.o. at [redacted]w[redacted]d here in MAU reporting:   Contractions every: irregularly  Onset of ctx: Yesterday Pain score: Pain Assessment Pain Assessment: 0-10 Pain Score: 7  Pain Location: Abdomen Pain Descriptors / Indicators: Contraction Pain Frequency: Intermittent Pain Onset: On-going  ROM: Membranes Sac Identifier: Sac 1 Membrane Status: Possible ROM - for evaluation Color: Clear, Pink Odor: Normal Amount: Moderate  Vaginal Bleeding: Vaginal Bleeding Vag. Bleeding: Bloody Show  Last SVE: N/A  Epidural: Not planning  Fetal Movement: Reports decreased FM  FHT:  Bypass triage  There were no vitals filed for this visit.     OB Office: Faculty GBS: Positive HSV: Denies hx of HSV Lab orders placed from triage: N/A

## 2022-12-08 NOTE — H&P (Signed)
Rebecca Church is a 27 y.o. female presenting to hospital for PROM at 8am this morning. Having some cramping and pelvic pain, but no contractions. Good fetal movement. OB History     Gravida  1   Para  0   Term  0   Preterm  0   AB  0   Living  0      SAB  0   IAB  0   Ectopic  0   Multiple  0   Live Births  0          Past Medical History:  Diagnosis Date   Gonorrhea 02/02/2019   Medical history non-contributory    Past Surgical History:  Procedure Laterality Date   NO PAST SURGERIES     Family History: family history includes Healthy in her mother. Social History:  reports that she has never smoked. She has never used smokeless tobacco. She reports that she does not drink alcohol and does not use drugs.     Maternal Diabetes: No Genetic Screening: Normal Maternal Ultrasounds/Referrals: Normal Fetal Ultrasounds or other Referrals:  None Maternal Substance Abuse:  No Significant Maternal Medications:  None Significant Maternal Lab Results:  Group B Strep positive Number of Prenatal Visits:greater than 3 verified prenatal visits Other Comments:  None  Review of Systems History   Blood pressure (!) 126/91, pulse 92, temperature 97.8 F (36.6 C), temperature source Oral, resp. rate 18, last menstrual period 03/12/2022, SpO2 100 %. Maternal Exam:  Uterine Assessment: Contraction strength is moderate.  Contraction frequency is regular.  Abdomen: Patient reports no abdominal tenderness. Fundal height is term.   Estimated fetal weight is 7.   Fetal presentation: vertex   Physical Exam Vitals and nursing note reviewed.  Constitutional:      Appearance: Normal appearance.  Cardiovascular:     Rate and Rhythm: Normal rate and regular rhythm.     Pulses: Normal pulses.     Heart sounds: Normal heart sounds.  Pulmonary:     Effort: Pulmonary effort is normal.  Abdominal:     General: Abdomen is flat.  Skin:    General: Skin is warm and dry.      Capillary Refill: Capillary refill takes less than 2 seconds.  Neurological:     Mental Status: She is alert.  Psychiatric:        Mood and Affect: Mood normal.        Behavior: Behavior normal.        Thought Content: Thought content normal.        Judgment: Judgment normal.     Prenatal labs: ABO, Rh: --/--/PENDING (05/25 1415) Antibody: PENDING (05/25 1415) Rubella: 1.69 (12/04 1020) RPR: Non Reactive (03/14 0834)  HBsAg: Negative (12/04 1020)  HIV: Non Reactive (03/14 0834)  GBS: Positive/-- (05/10 1134)   Assessment/Plan: PROM -  Discussed options with patient. At this point, she would like to see if labor would happen on its own. We discussed risks of infection. PCN for GBS prophylaxis Female baby - wants circ for the baby. Plans on breastfeeding.   Levie Heritage 12/08/2022, 3:31 PM

## 2022-12-08 NOTE — Progress Notes (Signed)
Patient ID: Rebecca Church, female   DOB: [birth year], 27 y.o.   MRN: [id number] Doing well Rates pain a "7" but appear fairly comfortable  Vitals:   12/08/22 1345 12/08/22 1500 12/08/22 1805 12/08/22 1919  BP: 113/69 (!) 126/91 117/74 115/80  Pulse: (!) 114 92 96 84  Resp: 18 18    Temp: 98.2 F (36.8 C) 97.8 F (36.6 C)  98 F (36.7 C)  TempSrc: Oral Oral  Oral  SpO2: 100% 100%     FHR stable UCs irregular  Cervical exam deferred  Discussed process of labor and possible need for Pitocin later

## 2022-12-09 ENCOUNTER — Inpatient Hospital Stay (HOSPITAL_COMMUNITY): Payer: 59 | Admitting: Anesthesiology

## 2022-12-09 ENCOUNTER — Encounter (HOSPITAL_COMMUNITY): Payer: Self-pay | Admitting: Family Medicine

## 2022-12-09 ENCOUNTER — Other Ambulatory Visit: Payer: Self-pay

## 2022-12-09 DIAGNOSIS — Z3A38 38 weeks gestation of pregnancy: Secondary | ICD-10-CM

## 2022-12-09 DIAGNOSIS — O4202 Full-term premature rupture of membranes, onset of labor within 24 hours of rupture: Secondary | ICD-10-CM

## 2022-12-09 DIAGNOSIS — O99820 Streptococcus B carrier state complicating pregnancy: Secondary | ICD-10-CM

## 2022-12-09 LAB — RPR: RPR Ser Ql: NONREACTIVE

## 2022-12-09 MED ORDER — TETANUS-DIPHTH-ACELL PERTUSSIS 5-2.5-18.5 LF-MCG/0.5 IM SUSY: 0.5000 mL | PREFILLED_SYRINGE | Freq: Once | INTRAMUSCULAR | Status: DC

## 2022-12-09 MED ORDER — LIDOCAINE HCL (PF) 1 % IJ SOLN
INTRAMUSCULAR | Status: DC | PRN
  Administered 2022-12-09: 3 mL via EPIDURAL

## 2022-12-09 MED ORDER — OXYCODONE-ACETAMINOPHEN 5-325 MG PO TABS: 1.0000 | ORAL_TABLET | ORAL | Status: DC | PRN

## 2022-12-09 MED ORDER — DIBUCAINE (PERIANAL) 1 % EX OINT: 1.0000 | TOPICAL_OINTMENT | CUTANEOUS | Status: DC | PRN

## 2022-12-09 MED ORDER — ZOLPIDEM TARTRATE 5 MG PO TABS: 5.0000 mg | ORAL_TABLET | Freq: Every evening | ORAL | Status: DC | PRN

## 2022-12-09 MED ORDER — PHENYLEPHRINE 80 MCG/ML (10ML) SYRINGE FOR IV PUSH (FOR BLOOD PRESSURE SUPPORT)
80.0000 ug | PREFILLED_SYRINGE | INTRAVENOUS | Status: DC | PRN
  Filled 2022-12-09: qty 10

## 2022-12-09 MED ORDER — EPHEDRINE 5 MG/ML INJ: 10.0000 mg | INTRAVENOUS | Status: DC | PRN

## 2022-12-09 MED ORDER — IBUPROFEN 600 MG PO TABS
600.0000 mg | ORAL_TABLET | Freq: Four times a day (QID) | ORAL | Status: DC
  Administered 2022-12-09 – 2022-12-10 (×3): 600 mg via ORAL
  Filled 2022-12-09 (×6): qty 1

## 2022-12-09 MED ORDER — ONDANSETRON HCL 4 MG PO TABS: 4.0000 mg | ORAL_TABLET | ORAL | Status: DC | PRN

## 2022-12-09 MED ORDER — FENTANYL-BUPIVACAINE-NACL 0.5-0.125-0.9 MG/250ML-% EP SOLN
12.0000 mL/h | EPIDURAL | Status: DC | PRN
  Filled 2022-12-09: qty 250

## 2022-12-09 MED ORDER — PRENATAL MULTIVITAMIN CH
1.0000 | ORAL_TABLET | Freq: Every day | ORAL | Status: DC
  Administered 2022-12-10 – 2022-12-11 (×2): 1 via ORAL
  Filled 2022-12-09 (×2): qty 1

## 2022-12-09 MED ORDER — SENNOSIDES-DOCUSATE SODIUM 8.6-50 MG PO TABS
2.0000 | ORAL_TABLET | Freq: Every day | ORAL | Status: DC
  Administered 2022-12-10 – 2022-12-11 (×2): 2 via ORAL
  Filled 2022-12-09 (×2): qty 2

## 2022-12-09 MED ORDER — ONDANSETRON HCL 4 MG/2ML IJ SOLN: 4.0000 mg | INTRAMUSCULAR | Status: DC | PRN

## 2022-12-09 MED ORDER — BENZOCAINE-MENTHOL 20-0.5 % EX AERO
1.0000 | INHALATION_SPRAY | CUTANEOUS | Status: DC | PRN
  Administered 2022-12-10: 1 via TOPICAL
  Filled 2022-12-09: qty 56

## 2022-12-09 MED ORDER — PHENYLEPHRINE 80 MCG/ML (10ML) SYRINGE FOR IV PUSH (FOR BLOOD PRESSURE SUPPORT): 80.0000 ug | PREFILLED_SYRINGE | INTRAVENOUS | Status: DC | PRN

## 2022-12-09 MED ORDER — MISOPROSTOL 25 MCG QUARTER TABLET
25.0000 ug | ORAL_TABLET | ORAL | Status: DC
  Administered 2022-12-09 (×2): 25 ug via ORAL
  Filled 2022-12-09 (×2): qty 1

## 2022-12-09 MED ORDER — LIDOCAINE-EPINEPHRINE (PF) 1.5 %-1:200000 IJ SOLN
INTRAMUSCULAR | Status: DC | PRN
  Administered 2022-12-09: 3 mL via EPIDURAL

## 2022-12-09 MED ORDER — OXYCODONE-ACETAMINOPHEN 5-325 MG PO TABS: 2.0000 | ORAL_TABLET | ORAL | Status: DC | PRN

## 2022-12-09 MED ORDER — WITCH HAZEL-GLYCERIN EX PADS: 1.0000 | MEDICATED_PAD | CUTANEOUS | Status: DC | PRN

## 2022-12-09 MED ORDER — DIPHENHYDRAMINE HCL 25 MG PO CAPS: 25.0000 mg | ORAL_CAPSULE | Freq: Four times a day (QID) | ORAL | Status: DC | PRN

## 2022-12-09 MED ORDER — OXYTOCIN-SODIUM CHLORIDE 30-0.9 UT/500ML-% IV SOLN
1.0000 m[IU]/min | INTRAVENOUS | Status: DC
  Administered 2022-12-09: 2 m[IU]/min via INTRAVENOUS
  Filled 2022-12-09: qty 500

## 2022-12-09 MED ORDER — MISOPROSTOL 50MCG HALF TABLET
50.0000 ug | ORAL_TABLET | ORAL | Status: DC
  Administered 2022-12-09: 50 ug via ORAL
  Filled 2022-12-09: qty 1

## 2022-12-09 MED ORDER — SIMETHICONE 80 MG PO CHEW: 80.0000 mg | CHEWABLE_TABLET | ORAL | Status: DC | PRN

## 2022-12-09 MED ORDER — COCONUT OIL OIL: 1.0000 | TOPICAL_OIL | Status: DC | PRN

## 2022-12-09 MED ORDER — ACETAMINOPHEN 325 MG PO TABS: 650.0000 mg | ORAL_TABLET | ORAL | Status: DC | PRN

## 2022-12-09 MED ORDER — LACTATED RINGERS IV SOLN
500.0000 mL | Freq: Once | INTRAVENOUS | Status: AC
  Administered 2022-12-09: 500 mL via INTRAVENOUS

## 2022-12-09 MED ORDER — DIPHENHYDRAMINE HCL 50 MG/ML IJ SOLN: 12.5000 mg | INTRAMUSCULAR | Status: DC | PRN

## 2022-12-09 MED ORDER — TERBUTALINE SULFATE 1 MG/ML IJ SOLN
0.2500 mg | Freq: Once | INTRAMUSCULAR | Status: AC | PRN
  Administered 2022-12-09: 0.25 mg via SUBCUTANEOUS
  Filled 2022-12-09: qty 1

## 2022-12-09 NOTE — Lactation Note (Addendum)
This note was copied from a baby's chart. Lactation Consultation Note  Patient Name: Rebecca Church LOVFI'E Date: 12/09/2022 Age:27 hours Reason for consult: Initial assessment;1st time breastfeeding;Early term 37-38.6wks.  Per Birth Parent, she attended breastfeeding classes prenatally.  Per Birth Parent, infant is latching well, BF for 15 minutes at 1500 pm. When LC was talking to Birth Parent infant started cuing again, Birth Parent attempted latch infant on her left breast using the cross cradle hold but infant was not interested in feeding again and became sleepy. Birth Parent was doing STS with infant when LC left the room. LC discussed various feeding positions, the importance of maternal rest, diet and hydration. Infant's input and out put the first day of life. Birth Parent knows if it is time for a feeding and if infant doesn't latch she can hand express and give infant back colostrum on a spoon. Birth Parent will continue to BF infant by cues, on demand, 8 to 12 + times within 24 hours, skin to skin. LC discussed hand expression and demonstrated hand expression using breast model. Birth Parent knows to call RN/LC for further latch assistance if needed. Birth Parent was  made aware of O/P services, breastfeeding support groups, community resources, and our phone # for post-discharge questions.    Maternal Data Has patient been taught Hand Expression?: Yes Does the patient have breastfeeding experience prior to this delivery?: No  Feeding Mother's Current Feeding Choice: Breast Milk  LATCH Score ( Infant was not interested in feeding at this time- sleepy).  Latch: Too sleepy or reluctant, no latch achieved, no sucking elicited.  Audible Swallowing: None  Type of Nipple: Everted at rest and after stimulation  Comfort (Breast/Nipple): Soft / non-tender  Hold (Positioning): Assistance needed to correctly position infant at breast and maintain latch.  LATCH Score: 5   Lactation  Tools Discussed/Used    Interventions Interventions: Breast feeding basics reviewed;Adjust position;Support pillows;Assisted with latch;Position options;Skin to skin;Hand express;Breast compression;LC Services brochure  Discharge Pump: DEBP;Personal  Consult Status Consult Status: Follow-up Date: 12/10/22 Follow-up type: In-patient    Frederico Hamman 12/09/2022, 4:22 PM

## 2022-12-09 NOTE — Anesthesia Procedure Notes (Signed)
Epidural Patient location during procedure: OB Start time: 12/09/2022 4:50 AM End time: 12/09/2022 5:14 AM  Staffing Anesthesiologist: Lewie Loron, MD Performed: anesthesiologist   Preanesthetic Checklist Completed: patient identified, IV checked, risks and benefits discussed, monitors and equipment checked, pre-op evaluation and timeout performed  Epidural Patient position: sitting Prep: DuraPrep and site prepped and draped Patient monitoring: heart rate, continuous pulse ox and blood pressure Approach: midline Location: L3-L4 Injection technique: LOR air and LOR saline  Needle:  Needle type: Tuohy  Needle gauge: 17 G Needle length: 9 cm Needle insertion depth: 7 cm Catheter type: closed end flexible Catheter size: 19 Gauge Catheter at skin depth: 13 cm Test dose: negative and 1.5% lidocaine with Epi 1:200 K  Assessment Sensory level: T8 Events: blood not aspirated, no cerebrospinal fluid, injection not painful, no injection resistance, no paresthesia and negative IV test  Additional Notes Reason for block:procedure for pain

## 2022-12-09 NOTE — Discharge Summary (Signed)
Postpartum Discharge Summary      Patient Name: Rebecca Church DOB: [birth year] MRN: [id number]  Date of admission: 12/08/2022 Delivery date:12/09/2022  Delivering provider: Levie Heritage  Date of discharge: 12/11/2022  Admitting diagnosis: Post-dates pregnancy [O48.0] Intrauterine pregnancy: [redacted]w[redacted]d     Secondary diagnosis:  Principal Problem:   Post-dates pregnancy  Additional problems: none    Discharge diagnosis: Term Pregnancy Delivered                                              Post partum procedures: None Augmentation: AROM, Pitocin, and Cytotec Complications: None  Hospital course: Induction of Labor With Vaginal Delivery   27 y.o. yo G1P0000 at [redacted]w[redacted]d was admitted to the hospital 12/08/2022 for induction of labor.  Indication for induction: PROM.  Patient had an labor course that was uncomplicated. Membrane Rupture Time/Date: 8:00 AM ,12/08/2022   Delivery Method:Vaginal, Spontaneous  Episiotomy: None  Lacerations:  1st degree;Perineal  Details of delivery can be found in separate delivery note.  Patient had a uncomplicatedpostpartum course. Patient is discharged home 12/11/22.  Newborn Data: Birth date:12/09/2022  Birth time:12:04 PM  Gender:Female  Living status:Living  Apgars:9 ,9  Weight:3050 g   Magnesium Sulfate received: No BMZ received: No Rhophylac:N/A MMR:No T-DaP: declined Flu: N/A Transfusion:No  Physical exam  Vitals:   12/10/22 0500 12/10/22 1341 12/10/22 2034 12/11/22 0515  BP: 95/62 94/76 105/67 99/63  Pulse: 86 (!) 103 87 74  Resp: 16 16 17 17   Temp: 98 F (36.7 C) 98.7 F (37.1 C) 98.7 F (37.1 C) 98.6 F (37 C)  TempSrc: Oral Oral Oral Oral  SpO2: 99% 97% 99% 100%  Weight:      Height:       General: alert, cooperative, and no distress Lochia: appropriate Uterine Fundus: firm Incision: N/A DVT Evaluation: No evidence of DVT seen on physical exam. Labs: Lab Results  Component Value Date   WBC 10.6 (H) 12/10/2022   HGB 9.1  (L) 12/10/2022   HCT 28.8 (L) 12/10/2022   MCV 84.0 12/10/2022   PLT 210 12/10/2022      Latest Ref Rng & Units 06/06/2018    4:04 PM  CMP  Glucose 70 - 99 mg/dL 98   BUN 6 - 20 mg/dL 6   Creatinine 4.09 - 8.11 mg/dL 9.14   Sodium 782 - 956 mmol/L 138   Potassium 3.5 - 5.1 mmol/L 3.6   Chloride 98 - 111 mmol/L 103   CO2 22 - 32 mmol/L 27   Calcium 8.9 - 10.3 mg/dL 9.2   Total Protein 6.5 - 8.1 g/dL 7.1   Total Bilirubin 0.3 - 1.2 mg/dL 0.8   Alkaline Phos 38 - 126 U/L 48   AST 15 - 41 U/L 20   ALT 0 - 44 U/L 15    Edinburgh Score:    12/10/2022    8:34 PM  Edinburgh Postnatal Depression Scale Screening Tool  I have been able to laugh and see the funny side of things. 0  I have looked forward with enjoyment to things. 0  I have blamed myself unnecessarily when things went wrong. 0  I have been anxious or worried for no good reason. 0  I have felt scared or panicky for no good reason. 0  Things have been getting on top of me. 0  I have  been so unhappy that I have had difficulty sleeping. 0  I have felt sad or miserable. 0  I have been so unhappy that I have been crying. 0  The thought of harming myself has occurred to me. 0  Edinburgh Postnatal Depression Scale Total 0     After visit meds:  Allergies as of 12/11/2022   No Known Allergies      Medication List     TAKE these medications    acetaminophen 325 MG tablet Commonly known as: Tylenol Take 2 tablets (650 mg total) by mouth every 4 (four) hours as needed (for pain scale < 4).   ferrous sulfate 325 (65 FE) MG tablet Take 1 tablet (325 mg total) by mouth every other day.   ibuprofen 600 MG tablet Commonly known as: ADVIL Take 1 tablet (600 mg total) by mouth every 6 (six) hours.   prenatal vitamin w/FE, FA 29-1 MG Chew chewable tablet Chew 1 tablet by mouth daily at 12 noon.         Discharge home in stable condition Infant Feeding: Breast Infant Disposition:home with mother Discharge  instruction: per After Visit Summary and Postpartum booklet. Activity: Advance as tolerated. Pelvic rest for 6 weeks.  Diet: routine diet Future Appointments: Future Appointments  Date Time Provider Department Center  12/14/2022  9:15 AM Federico Flake, MD [facility]  12/17/2022  1:15 PM [facility]  12/17/2022  1:55 PM Federico Flake, MD [facility]   Follow up Visit: send   Please schedule this patient for a In person postpartum visit in 4 weeks with the following provider: Any provider. Additional Postpartum F/U: PAP   Low risk pregnancy complicated by:  none Delivery mode:  Vaginal, Spontaneous  Anticipated Birth Control:  Condoms   12/11/2022 Celedonio Savage, MD

## 2022-12-09 NOTE — Progress Notes (Signed)
Patient ID: Rebecca Church, female   DOB: 1995-11-19, 27 y.o.   MRN: [id number] Wants epidural  Vitals:   12/08/22 2130 12/08/22 2227 12/09/22 0001 12/09/22 0105  BP:  (!) 95/55  (!) 96/53  Pulse:  93  90  Resp:  15  17  Temp: 98.2 F (36.8 C)  98 F (36.7 C) 98.1 F (36.7 C)  TempSrc: Oral  Oral Oral  SpO2:       FHR stable Having some UCs off Cytotec  Dilation: 1.5 Effacement (%): 60 Station: -1 Presentation: Vertex Exam by:: [place] Turner RN  Will do another cytotec

## 2022-12-09 NOTE — Anesthesia Preprocedure Evaluation (Signed)
Anesthesia Evaluation  Patient identified by MRN, date of birth, ID band Patient awake    Reviewed: Allergy & Precautions, Patient's Chart, lab work & pertinent test results  Airway Mallampati: II  TM Distance: >3 FB Neck ROM: Full    Dental no notable dental hx.    Pulmonary neg pulmonary ROS   Pulmonary exam normal breath sounds clear to auscultation       Cardiovascular negative cardio ROS Normal cardiovascular exam Rhythm:Regular Rate:Normal     Neuro/Psych negative neurological ROS     GI/Hepatic negative GI ROS, Neg liver ROS,,,  Endo/Other  negative endocrine ROS    Renal/GU negative Renal ROS     Musculoskeletal negative musculoskeletal ROS (+)    Abdominal  (+) + obese  Peds  Hematology negative hematology ROS (+)   Anesthesia Other Findings   Reproductive/Obstetrics (+) Pregnancy                             Anesthesia Physical Anesthesia Plan  ASA: 2  Anesthesia Plan: Epidural   Post-op Pain Management:    Induction:   PONV Risk Score and Plan:   Airway Management Planned:   Additional Equipment:   Intra-op Plan:   Post-operative Plan:   Informed Consent: I have reviewed the patients History and Physical, chart, labs and discussed the procedure including the risks, benefits and alternatives for the proposed anesthesia with the patient or authorized representative who has indicated his/her understanding and acceptance.       Plan Discussed with:   Anesthesia Plan Comments:        Anesthesia Quick Evaluation

## 2022-12-09 NOTE — Progress Notes (Signed)
Patient ID: Rebecca Church, female   DOB: Aug 30, 1995, 27 y.o.   MRN: [id number] Still not contracting much  DIscussed option of starting with Cytotec instead of Pitocin as she is very hesitant to start Pitocin  Vitals:   12/08/22 1805 12/08/22 1919 12/08/22 2130 12/08/22 2227  BP: 117/74 115/80  (!) 95/55  Pulse: 96 84  93  Resp:      Temp:  98 F (36.7 C) 98.2 F (36.8 C)   TempSrc:  Oral Oral   SpO2:       FHR stable  Dilation: 1 Effacement (%): 50 Station: -1, -2 Presentation: Vertex Exam by:: Wynelle Bourgeois CNM  Will give Cytotec PO q 4 hours

## 2022-12-09 NOTE — Plan of Care (Signed)

## 2022-12-09 NOTE — Progress Notes (Signed)
Rebecca Church is a 27 y.o. G1P0000 at [redacted]w[redacted]d by LMP admitted for active labor, rupture of membranes, Preterm labor  Subjective:   Objective: BP 117/75   Pulse 80   Temp 98.3 F (36.8 C) (Axillary)   Resp 16   LMP 03/12/2022 (Exact Date)   SpO2 100%  No intake/output data recorded. No intake/output data recorded.  FHT:  FHR: 130 bpm, variability: moderate,  accelerations:  Present,  decelerations:  Absent UC:   irregular, every 2-7 minutes SVE:   Dilation: 1.5 Effacement (%): 60 Station: -1 Exam by:: [place] Turner RN  Labs: Lab Results  Component Value Date   WBC 10.1 12/08/2022   HGB 11.7 (L) 12/08/2022   HCT 36.9 12/08/2022   MCV 84.8 12/08/2022   PLT 272 12/08/2022    Assessment / Plan: PROM - on cytotec, increase to .  GBS positive - PCN.   Levie Heritage, DO 12/09/2022, 8:33 AM

## 2022-12-09 NOTE — Progress Notes (Signed)
Patient ID: Rebecca Church, female   DOB: 10/03/1995, 27 y.o.   MRN: [id number]  Called to patient room for prolonged decel.   Cervix 8/90/-1.  Terbutaline given Fluid bolus  FHR improved. Will hold off for 20-30 min, then break forebag or start pitcoin.  Levie Heritage, DO

## 2022-12-10 LAB — CBC
HCT: 28.8 % — ABNORMAL LOW (ref 36.0–46.0)
Hemoglobin: 9.1 g/dL — ABNORMAL LOW (ref 12.0–15.0)
MCH: 26.5 pg (ref 26.0–34.0)
MCHC: 31.6 g/dL (ref 30.0–36.0)
MCV: 84 fL (ref 80.0–100.0)
Platelets: 210 10*3/uL (ref 150–400)
RBC: 3.43 MIL/uL — ABNORMAL LOW (ref 3.87–5.11)
RDW: 15.2 % (ref 11.5–15.5)
WBC: 10.6 10*3/uL — ABNORMAL HIGH (ref 4.0–10.5)
nRBC: 0 % (ref 0.0–0.2)

## 2022-12-10 MED ORDER — FERROUS SULFATE 325 (65 FE) MG PO TABS: 325.0000 mg | ORAL_TABLET | ORAL | Status: DC

## 2022-12-10 NOTE — Anesthesia Postprocedure Evaluation (Signed)
Anesthesia Post Note  Patient: Rebecca Church  Procedure(s) Performed: AN AD HOC LABOR EPIDURAL     Patient location during evaluation: Mother Baby Anesthesia Type: Epidural Level of consciousness: awake and alert Pain management: pain level controlled Vital Signs Assessment: post-procedure vital signs reviewed and stable Respiratory status: spontaneous breathing, nonlabored ventilation and respiratory function stable Cardiovascular status: stable Postop Assessment: no headache, no backache and epidural receding Anesthetic complications: no   No notable events documented.  Last Vitals:  Vitals:   12/10/22 0054 12/10/22 0500  BP: 95/63 95/62  Pulse: 88 86  Resp: 17 16  Temp: 36.8 C 36.7 C  SpO2: 100% 99%    Last Pain:  Vitals:   12/10/22 0730  TempSrc:   PainSc: 0-No pain   Pain Goal:                   [employer]

## 2022-12-10 NOTE — Progress Notes (Addendum)
Post Partum Day 1 Subjective: Eating, drinking, voiding, ambulating well.  +flatus.  Lochia and pain wnl.  Denies dizziness, lightheadedness, or sob. No complaints.   Objective: Blood pressure 95/62, pulse 86, temperature 98 F (36.7 C), temperature source Oral, resp. rate 16, height 5\' 11"  (1.803 m), weight 112.3 kg, last menstrual period 03/12/2022, SpO2 99 %, unknown if currently breastfeeding.  Physical Exam:  General: alert, cooperative, and no distress Lochia: appropriate Uterine Fundus: firm Incision: n/a DVT Evaluation: No evidence of DVT seen on physical exam. Negative Homan's sign. No cords or calf tenderness. No significant calf/ankle edema.  Recent Labs    12/08/22 1415 12/10/22 0456  HGB 11.7* 9.1*  HCT 36.9 28.8*    Assessment/Plan: Plan for discharge tomorrow, Breastfeeding, Circumcision prior to discharge, and Contraception plans condoms. Add po Fe QOD   LOS: 2 days   Cheral Marker, CNM 12/10/2022, 12:43 PM   CNM Circumcision Counseling Progress Note  Patient desires circumcision for her female infant.  Circumcision procedure details discussed, risks and benefits of procedure were also discussed.  These include but are not limited to: Benefits of circumcision in men include reduction in the rates of urinary tract infection (UTI), penile cancer, some sexually transmitted infections, penile inflammatory and retractile disorders, as well as easier hygiene.  Risks include bleeding , infection, injury of glans which may lead to penile deformity or urinary tract issues, unsatisfactory cosmetic appearance and other potential complications related to the procedure.  It was emphasized that this is an elective procedure.  Patient wants to proceed with circumcision; written informed consent will be obtained.  Will have MD do circumcision when infant is cleared for such by peds.  Joellyn Haff, CNM, [facility] 12/10/2022   12:46 PM

## 2022-12-10 NOTE — Lactation Note (Signed)
This note was copied from a baby's chart. Lactation Consultation Note  Patient Name: Rebecca Church [id number] Date: 12/10/2022 Age:27 hours Reason for consult: Follow-up assessment;1st time breastfeeding;Early term 37-38.6wks  P1, Father holding infant who was demonstrating feeding cues which were pointed out to parents.  Had mother hand express prior to latching baby. Assisted with latching baby in cross cradle hold adding pillows for support.  Baby opens wide to latch.  Suggest compressing breast to keep him active. Discussed frequency and cluster feeding. Suggest calling for help as needed.    Maternal Data Has patient been taught Hand Expression?: Yes Does the patient have breastfeeding experience prior to this delivery?: No  Feeding Mother's Current Feeding Choice: Breast Milk  LATCH Score Latch: Grasps breast easily, tongue down, lips flanged, rhythmical sucking.  Audible Swallowing: A few with stimulation  Type of Nipple: Everted at rest and after stimulation  Comfort (Breast/Nipple): Soft / non-tender  Hold (Positioning): Assistance needed to correctly position infant at breast and maintain latch.  LATCH Score: 8  Interventions Interventions: Breast feeding basics reviewed;Assisted with latch;Skin to skin;Hand express;Education Consult Status Consult Status: Follow-up Date: 12/11/22 Follow-up type: In-patient    Dahlia Byes [facility] 12/10/2022, 11:01 AM

## 2022-12-11 ENCOUNTER — Other Ambulatory Visit (HOSPITAL_COMMUNITY): Payer: Self-pay

## 2022-12-11 LAB — CERVICOVAGINAL ANCILLARY ONLY
Bacterial Vaginitis (gardnerella): NEGATIVE
Candida Glabrata: NEGATIVE
Candida Vaginitis: NEGATIVE
Chlamydia: NEGATIVE
Comment: NEGATIVE
Comment: NEGATIVE
Comment: NEGATIVE
Comment: NEGATIVE
Comment: NEGATIVE
Comment: NORMAL
Neisseria Gonorrhea: NEGATIVE
Trichomonas: NEGATIVE

## 2022-12-11 MED ORDER — ACETAMINOPHEN 325 MG PO TABS
650.0000 mg | ORAL_TABLET | ORAL | 0 refills | Status: DC | PRN
  Filled 2022-12-11: qty 100, 9d supply, fill #0

## 2022-12-11 MED ORDER — IBUPROFEN 600 MG PO TABS
600.0000 mg | ORAL_TABLET | Freq: Four times a day (QID) | ORAL | 0 refills | Status: DC
  Filled 2022-12-11: qty 30, 8d supply, fill #0

## 2022-12-11 MED ORDER — FERROUS SULFATE 325 (65 FE) MG PO TABS
325.0000 mg | ORAL_TABLET | ORAL | 0 refills | Status: DC
  Filled 2022-12-11: qty 100, 200d supply, fill #0

## 2022-12-11 NOTE — Progress Notes (Signed)
Patient discharged home with SO, all personal items and infant in car seat. Discharge instructions reviewed, questions answered. Pt verbalized understand. Wheelchair offered, pt requested to walk out to car.

## 2022-12-11 NOTE — Lactation Note (Signed)
This note was copied from a baby's chart. Lactation Consultation Note  Patient Name: Rebecca Church [id number] Date: 12/11/2022 Age:28 hours , 9 % weight loss . Serum Bili 8.7 and the Skin Bili  approx 1 hour earlier 12.6.  Reason for consult: Follow-up assessment;1st time breastfeeding;Primapara;Early term 37-38.6wks;Infant weight loss;Breastfeeding assistance After diaper change of yellow brown stool , baby rooting and mom latched. LC checked the baby's lip lines and the lips were flanged and depth achieved. Swallows noted and per mom comfortable.  LC recommended due to the 9 % weight loss , after 3-4 feedings a day post pump 10 -15 mins and feed back to the baby.  LC reviewed BF D/C teaching and the [facility] resources.  LC answered mom questions regarding returning back to work at 6 weeks and preparation.  Maternal Data    Feeding Mother's Current Feeding Choice: Breast Milk  LATCH Score Latch: Grasps breast easily, tongue down, lips flanged, rhythmical sucking.  Audible Swallowing: Spontaneous and intermittent  Type of Nipple: Everted at rest and after stimulation  Comfort (Breast/Nipple): Filling, red/small blisters or bruises, mild/mod discomfort  Hold (Positioning): Assistance needed to correctly position infant at breast and maintain latch.  LATCH Score: 8   Lactation Tools Discussed/Used Tools: Pump;Flanges Flange Size: 21;24 Breast pump type: Manual Pump Education: Setup, frequency, and cleaning;Milk Storage Reason for Pumping: due to 9 % weight loss , LC recommended after 4 feedings and prn post pump 10 - 15 mins and supplement back to the baby  Interventions Interventions: Breast feeding basics reviewed;Assisted with latch;Skin to skin;Breast massage;Adjust position;Support pillows;Position options;Hand pump;Education;LC Services brochure  Discharge Discharge Education: Engorgement and breast care;Warning signs for feeding baby Pump: DEBP;Personal;Manual  Consult  Status Consult Status: Complete Date: 12/11/22    Kathrin Greathouse 12/11/2022, 8:45 AM

## 2022-12-13 ENCOUNTER — Encounter: Payer: Self-pay | Admitting: General Practice

## 2022-12-14 ENCOUNTER — Encounter: Payer: 59 | Admitting: Family Medicine

## 2022-12-17 ENCOUNTER — Other Ambulatory Visit: Payer: 59

## 2022-12-17 ENCOUNTER — Encounter: Payer: 59 | Admitting: Family Medicine

## 2022-12-19 ENCOUNTER — Telehealth (HOSPITAL_COMMUNITY): Payer: Self-pay | Admitting: *Deleted

## 2022-12-19 NOTE — Telephone Encounter (Signed)
Mom reports feeling good. No concerns about herself at this time. EPDS declined. Mom reports feeling well emotionally. [facility] score=0) Mom reports baby is doing well. Feeding, peeing, and pooping without difficulty. Mom reports no concerns about baby at present.  Duffy Rhody, RN 12-19-2022 at 9:25am

## 2022-12-24 ENCOUNTER — Inpatient Hospital Stay (HOSPITAL_COMMUNITY): Admission: RE | Admit: 2022-12-24 | Payer: 59 | Source: Home / Self Care | Admitting: Family Medicine

## 2022-12-24 ENCOUNTER — Inpatient Hospital Stay (HOSPITAL_COMMUNITY): Payer: 59

## 2023-01-10 NOTE — Progress Notes (Signed)
Post Partum Visit Note  Julizza Chiari is a 27 y.o. G7P1001 female who presents for a postpartum visit. She is 4 weeks postpartum following a normal spontaneous vaginal delivery.  I have fully reviewed the prenatal and intrapartum course. The delivery was at 38.6 gestational weeks.  Anesthesia: epidural. Postpartum course has been uneventful. Baby is doing well. Baby is feeding by breast. Bleeding no bleeding. Bowel function is normal. Bladder function is normal. Patient is not sexually active. Contraception method is none. Postpartum depression screening: negative.   Upstream - 01/11/23 0828       Pregnancy Intention Screening   Does the patient want to become pregnant in the next year? No    Does the patient's partner want to become pregnant in the next year? No    Would the patient like to discuss contraceptive options today? No            The pregnancy intention screening data noted above was reviewed. Potential methods of contraception were discussed. The patient elected to proceed with No data recorded.   Edinburgh Postnatal Depression Scale - 01/11/23 0827       Edinburgh Postnatal Depression Scale:  In the Past 7 Days   I have been able to laugh and see the funny side of things. 0    I have looked forward with enjoyment to things. 0    I have blamed myself unnecessarily when things went wrong. 0    I have been anxious or worried for no good reason. 0    I have felt scared or panicky for no good reason. 0    Things have been getting on top of me. 0    I have been so unhappy that I have had difficulty sleeping. 0    I have felt sad or miserable. 0    I have been so unhappy that I have been crying. 0    The thought of harming myself has occurred to me. 0    Edinburgh Postnatal Depression Scale Total 0             Health Maintenance Due  Topic Date Due   HPV VACCINES (1 - 2-dose series) Never done   DTaP/Tdap/Td (1 - Tdap) Never done   COVID-19 Vaccine (3 -  2023-24 season) 03/16/2022    The following portions of the patient's history were reviewed and updated as appropriate: allergies, current medications, past family history, past medical history, past social history, past surgical history, and problem list.  Review of Systems Pertinent items noted in HPI and remainder of comprehensive ROS otherwise negative.  Objective:  BP 97/65   Pulse 93   Wt 223 lb 9.6 oz (101.4 kg)   LMP 03/12/2022 (Exact Date)   BMI 31.19 kg/m    General:  alert, cooperative, and appears stated age   Breasts:  not indicated  Lungs: Comfortalbe on room air  Wound N/a  GU exam:  normal        Assessment:   Postpartum care and examination  Screening for cervical cancer - Plan: Cytology - PAP( [place])   Normal postpartum exam.   Plan:   Essential components of care per ACOG recommendations:  1.  Mood and well being: Patient with negative depression screening today. Reviewed local resources for support.  - Patient tobacco use? No.   - hx of drug use? No.    2. Infant care and feeding:  -Patient currently breastmilk feeding? Yes. Reviewed importance of draining  breast regularly to support lactation.  -Social determinants of health (SDOH) reviewed in EPIC. No concerns  3. Sexuality, contraception and birth spacing - Patient does not want a pregnancy in the next year.  Desired family size is unsure number children.  - Reviewed reproductive life planning. Reviewed contraceptive methods based on pt preferences and effectiveness.  Patient desired Female Condom today.   - Discussed birth spacing of 18 months  4. Sleep and fatigue -Encouraged family/partner/community support of 4 hrs of uninterrupted sleep to help with mood and fatigue  5. Physical Recovery  - Discussed patients delivery and complications. She describes her labor as good. - Patient had a Vaginal, no problems at delivery. Patient had a 1st degree laceration. Perineal healing reviewed.  Patient expressed understanding - Patient has urinary incontinence? No. - Patient is safe to resume physical and sexual activity  6.  Health Maintenance - HM due items addressed Yes - Last pap smear  Diagnosis  Date Value Ref Range Status  05/12/2020   Final   - Negative for intraepithelial lesion or malignancy (NILM)   Pap smear done at today's visit.  -Breast Cancer screening indicated? No.   7. Chronic Disease/Pregnancy Condition follow up: None   Venora Maples, MD/MPH Attending Family Medicine Physician, [facility] for [facility], [facility]

## 2023-01-11 ENCOUNTER — Ambulatory Visit (INDEPENDENT_AMBULATORY_CARE_PROVIDER_SITE_OTHER): Payer: 59 | Admitting: Family Medicine

## 2023-01-11 ENCOUNTER — Encounter: Payer: Self-pay | Admitting: Family Medicine

## 2023-01-11 ENCOUNTER — Other Ambulatory Visit: Payer: Self-pay

## 2023-01-11 ENCOUNTER — Other Ambulatory Visit (HOSPITAL_COMMUNITY)
Admission: RE | Admit: 2023-01-11 | Discharge: 2023-01-11 | Disposition: A | Discharge status: Home / Self Care | Payer: 59 | Source: Ambulatory Visit | Attending: Family Medicine | Admitting: Family Medicine

## 2023-01-11 DIAGNOSIS — Z124 Encounter for screening for malignant neoplasm of cervix: Secondary | ICD-10-CM | POA: Diagnosis not present

## 2023-01-15 LAB — CYTOLOGY - PAP
Comment: NEGATIVE
Diagnosis: NEGATIVE
High risk HPV: NEGATIVE

## 2023-03-21 DIAGNOSIS — Z130 Encounter for screening for diseases of the blood and blood-forming organs and certain disorders involving the immune mechanism: Secondary | ICD-10-CM | POA: Diagnosis not present

## 2023-03-21 DIAGNOSIS — Z131 Encounter for screening for diabetes mellitus: Secondary | ICD-10-CM | POA: Diagnosis not present

## 2023-03-21 DIAGNOSIS — Z0000 Encounter for general adult medical examination without abnormal findings: Secondary | ICD-10-CM | POA: Diagnosis not present

## 2023-03-21 DIAGNOSIS — Z13220 Encounter for screening for lipoid disorders: Secondary | ICD-10-CM | POA: Diagnosis not present

## 2023-03-21 DIAGNOSIS — Z2821 Immunization not carried out because of patient refusal: Secondary | ICD-10-CM | POA: Diagnosis not present

## 2023-03-26 ENCOUNTER — Telehealth: Payer: 59 | Admitting: Physician Assistant

## 2023-03-26 DIAGNOSIS — R109 Unspecified abdominal pain: Secondary | ICD-10-CM

## 2023-03-27 ENCOUNTER — Encounter: Payer: Self-pay | Admitting: Family Medicine

## 2023-03-27 NOTE — Progress Notes (Signed)
Because of abdominal pain and need for examination, I feel your condition warrants further evaluation and I recommend that you be seen in a face to face visit.   NOTE: There will be NO CHARGE for this eVisit   If you are having a true medical emergency please call 911.      For an urgent face to face visit, [place] has eight urgent care centers for your convenience:   NEW!! [facility] Health Urgent Care Center at [facility] Get Driving Directions [phone] [address], [address] [place], [address]    [facility] Health Urgent Care Center at [facility] Get Driving Directions [phone] [address] [place], [place] [address]   [facility] Health Urgent Care Center [facility]) Get Driving Directions [phone] [address] [place], [place] [address]  [facility] Health Urgent Care Center [facility]) Get Driving Directions [phone] [address] [place],  [place]  [address]  [facility] Health Urgent Care Center [facility] - at [employer]  [phone] [address] [place],  [place] [address]   [facility] Health Urgent Care at [facility] Get Driving Directions [phone] [address] [place], [place] [address]   [facility] Health Urgent Care at [facility] Get Driving Directions  [phone] [address] [place], [place] [address]   [facility] Health Urgent Care at [facility] Directions [phone] [address], [address] [place], [place] [address]  Your MyChart E-visit questionnaire answers were reviewed by a board certified advanced clinical practitioner to complete your personal care plan based on your specific symptoms.  Thank you for using e-Visits.

## 2023-03-28 ENCOUNTER — Ambulatory Visit (HOSPITAL_COMMUNITY): Payer: Self-pay

## 2023-03-28 MED ORDER — AZESCHEW PRENATAL/POSTNATAL 13-1 MG PO CHEW: 1.0000 | CHEWABLE_TABLET | Freq: Every day | ORAL | 6 refills | Status: DC

## 2023-04-22 ENCOUNTER — Encounter (HOSPITAL_COMMUNITY): Payer: Self-pay

## 2023-04-22 ENCOUNTER — Ambulatory Visit (HOSPITAL_COMMUNITY)
Admission: RE | Admit: 2023-04-22 | Discharge: 2023-04-22 | Disposition: A | Discharge status: Home / Self Care | Payer: 59 | Source: Ambulatory Visit | Attending: Internal Medicine | Admitting: Internal Medicine

## 2023-04-22 VITALS — BP 119/70 | HR 71 | Temp 97.3°F | Resp 18

## 2023-04-22 DIAGNOSIS — S838X1A Sprain of other specified parts of right knee, initial encounter: Secondary | ICD-10-CM | POA: Diagnosis not present

## 2023-04-22 NOTE — ED Triage Notes (Signed)
Pt c/o rt knee pain and inflammation x2wks. States took ibuprofen with relief. Denies new injury but states has hit that knee awhile back and never got it checked.

## 2023-04-22 NOTE — ED Provider Notes (Signed)
[facility]    CSN: [id number] Arrival date & time: 04/22/23  1341      History   Chief Complaint Chief Complaint  Patient presents with   Knee Injury    Pain in my knee when bend it - Entered by patient   Knee Pain    HPI Rebecca Church is a 27 y.o. female comes to the urgent care with right knee pain which started a week and a half or so ago.  Patient denies any trauma to the right knee.  She recently started working out in the gym.  She does full body exercises.  Pain is about 6 out of 10 and appears to be aggravated by movement of the knee.  Knee does not buckle or give way.  No falls.  No redness.  No fever or chills.  Patient admits walking regularly.  She works about an hour a day.  HPI  Past Medical History:  Diagnosis Date   Gonorrhea 02/02/2019   Medical history non-contributory     There are no problems to display for this patient.   Past Surgical History:  Procedure Laterality Date   NO PAST SURGERIES      OB History     Gravida  1   Para  1   Term  1   Preterm  0   AB  0   Living  1      SAB  0   IAB  0   Ectopic  0   Multiple  0   Live Births  1            Home Medications    Prior to Admission medications   Medication Sig Start Date End Date Taking? Authorizing Provider  acetaminophen (TYLENOL) 325 MG tablet Take 2 tablets (650 mg total) by mouth every 4 (four) hours as needed (for pain scale < 4). 12/11/22   Celedonio Savage, MD  ferrous sulfate 325 (65 FE) MG tablet Take 1 tablet (325 mg total) by mouth every other day. 12/11/22   Celedonio Savage, MD  ibuprofen (ADVIL) 600 MG tablet Take 1 tablet (600 mg total) by mouth every 6 (six) hours. 12/11/22   Cresenzo, Cyndi Lennert, MD  prenatal vitamin w/FE, FA (NATACHEW) 29-1 MG CHEW chewable tablet Chew 1 tablet by mouth daily at 12 noon. 05/03/22   Anyanwu, Jethro Bastos, MD  Prenatal w/o A Vit-Fe Fum-FA (AZESCHEW PRENATAL/POSTNATAL) 13-1 MG CHEW Chew 1 each by mouth daily.  03/28/23   Federico Flake, MD    Family History Family History  Problem Relation Age of Onset   Healthy Mother     Social History Social History   Tobacco Use   Smoking status: Never   Smokeless tobacco: Never  Vaping Use   Vaping status: Never Used  Substance Use Topics   Alcohol use: No    Comment: occasionally   Drug use: No     Allergies   Patient has no known allergies.   Review of Systems Review of Systems As per HPI  Physical Exam Triage Vital Signs ED Triage Vitals  Encounter Vitals Group     BP 04/22/23 1418 119/70     Systolic BP Percentile --      Diastolic BP Percentile --      Pulse Rate 04/22/23 1418 71     Resp 04/22/23 1418 18     Temp 04/22/23 1418 (!) 97.3 F (36.3 C)     Temp Source  04/22/23 1418 Oral     SpO2 04/22/23 1418 97 %     Weight --      Height --      Head Circumference --      Peak Flow --      Pain Score 04/22/23 1419 6     Pain Loc --      Pain Education --      Exclude from Growth Chart --    No data found.  Updated Vital Signs BP 119/70 (BP Location: Left Arm)   Pulse 71   Temp (!) 97.3 F (36.3 C) (Oral)   Resp 18   SpO2 97%   Breastfeeding Yes   Visual Acuity Right Eye Distance:   Left Eye Distance:   Bilateral Distance:    Right Eye Near:   Left Eye Near:    Bilateral Near:     Physical Exam Vitals and nursing note reviewed.  Constitutional:      General: She is not in acute distress.    Appearance: She is not ill-appearing.  Cardiovascular:     Rate and Rhythm: Normal rate and regular rhythm.     Pulses: Normal pulses.     Heart sounds: Normal heart sounds.  Pulmonary:     Effort: Pulmonary effort is normal.     Breath sounds: Normal breath sounds.  Musculoskeletal:        General: No swelling, tenderness, deformity or signs of injury. Normal range of motion.  Skin:    General: Skin is warm.     Findings: No erythema or rash.  Neurological:     Mental Status: She is alert.       UC Treatments / Results  Labs (all labs ordered are listed, but only abnormal results are displayed) Labs Reviewed - No data to display  EKG   Radiology No results found.  Procedures Procedures (including critical care time)  Medications Ordered in UC Medications - No data to display  Initial Impression / Assessment and Plan / UC Course  I have reviewed the triage vital signs and the nursing notes.  Pertinent labs & imaging results that were available during my care of the patient were reviewed by me and considered in my medical decision making (see chart for details).     1.  Right knee sprain: Right knee sleeve Ibuprofen as needed for pain Gentle range of motion exercises Icing of the right knee Return precautions given.  Patient is advised to stretch before and after exercising. Final Clinical Impressions(s) / UC Diagnoses   Final diagnoses:  Sprain of other ligament of right knee, initial encounter     Discharge Instructions      Apply a compressive ACE bandage.  Rest and elevate the affected painful area.   Apply cold compresses intermittently as needed.   Please take ibuprofen as needed for pain As pain recedes, begin normal activities slowly as tolerated. Please return to urgent care if symptoms persist. There is no indication for x-rays at this time.      ED Prescriptions   None    PDMP not reviewed this encounter.   Merrilee Jansky, MD 04/22/23 [phone]

## 2023-04-22 NOTE — Discharge Instructions (Addendum)
Apply a compressive ACE bandage.  Rest and elevate the affected painful area.   Apply cold compresses intermittently as needed.   Please take ibuprofen as needed for pain As pain recedes, begin normal activities slowly as tolerated. Please return to urgent care if symptoms persist. There is no indication for x-rays at this time.

## 2023-05-09 NOTE — Plan of Care (Signed)
[facility] Tonsillectomy/Adenoidectomy, Postoperative PEDS care plan entered in error.

## 2023-05-14 DIAGNOSIS — F418 Other specified anxiety disorders: Secondary | ICD-10-CM | POA: Diagnosis not present

## 2023-05-14 DIAGNOSIS — O99345 Other mental disorders complicating the puerperium: Secondary | ICD-10-CM | POA: Diagnosis not present

## 2023-05-21 DIAGNOSIS — F4321 Adjustment disorder with depressed mood: Secondary | ICD-10-CM | POA: Diagnosis not present

## 2023-06-03 DIAGNOSIS — L219 Seborrheic dermatitis, unspecified: Secondary | ICD-10-CM | POA: Diagnosis not present

## 2023-07-05 DIAGNOSIS — F4321 Adjustment disorder with depressed mood: Secondary | ICD-10-CM | POA: Diagnosis not present

## 2023-07-26 DIAGNOSIS — F4321 Adjustment disorder with depressed mood: Secondary | ICD-10-CM | POA: Diagnosis not present

## 2023-10-01 DIAGNOSIS — E669 Obesity, unspecified: Secondary | ICD-10-CM | POA: Diagnosis not present

## 2023-10-01 DIAGNOSIS — Z6830 Body mass index (BMI) 30.0-30.9, adult: Secondary | ICD-10-CM | POA: Diagnosis not present

## 2023-12-10 ENCOUNTER — Ambulatory Visit (HOSPITAL_COMMUNITY): Payer: Self-pay

## 2023-12-11 ENCOUNTER — Ambulatory Visit (HOSPITAL_COMMUNITY): Payer: Self-pay

## 2023-12-11 ENCOUNTER — Encounter (HOSPITAL_COMMUNITY): Payer: Self-pay

## 2023-12-11 ENCOUNTER — Ambulatory Visit (HOSPITAL_COMMUNITY)
Admission: RE | Admit: 2023-12-11 | Discharge: 2023-12-11 | Disposition: A | Discharge status: Home / Self Care | Source: Ambulatory Visit | Attending: Emergency Medicine | Admitting: Emergency Medicine

## 2023-12-11 VITALS — BP 104/73 | HR 74 | Temp 97.4°F | Resp 16

## 2023-12-11 DIAGNOSIS — Z113 Encounter for screening for infections with a predominantly sexual mode of transmission: Secondary | ICD-10-CM | POA: Diagnosis not present

## 2023-12-11 LAB — HIV ANTIBODY (ROUTINE TESTING W REFLEX): HIV Screen 4th Generation wRfx: NONREACTIVE

## 2023-12-11 LAB — RPR: RPR Ser Ql: NONREACTIVE

## 2023-12-11 NOTE — ED Triage Notes (Signed)
 Patient here today to be tested for Stds. Denies symptoms.

## 2023-12-11 NOTE — ED Provider Notes (Signed)
 [facility]    CSN: [id number] Arrival date & time: 12/11/23  4010      History   Chief Complaint Chief Complaint  Patient presents with   SEXUALLY TRANSMITTED DISEASE    Just to get tested had unprotected sex - Entered by patient    HPI Cerena Baine is a 28 y.o. female.   Patient presents requesting STD testing.  Patient denies any abnormal vaginal discharge, vaginal pain, vaginal irritation, dysuria, urinary frequency/urgency, hematuria, abnormal vaginal bleeding, pelvic pain, abdominal pain, flank pain, and dysuria.  Reports recent unprotected sexual intercourse and just wants to make sure that she does not have any STDs from this.  LMP 11/28/2023  The history is provided by the patient and medical records.    Past Medical History:  Diagnosis Date   Gonorrhea 02/02/2019   Medical history non-contributory     There are no active problems to display for this patient.   Past Surgical History:  Procedure Laterality Date   NO PAST SURGERIES      OB History     Gravida  1   Para  1   Term  1   Preterm  0   AB  0   Living  1      SAB  0   IAB  0   Ectopic  0   Multiple  0   Live Births  1            Home Medications    Prior to Admission medications   Medication Sig Start Date End Date Taking? Authorizing Provider  ketoconazole (NIZORAL) 2 % shampoo Apply 1 Application topically daily as needed.    [provider]    Family History Family History  Problem Relation Age of Onset   Healthy Mother     Social History Social History   Tobacco Use   Smoking status: Never   Smokeless tobacco: Never  Vaping Use   Vaping status: Never Used  Substance Use Topics   Alcohol use: No    Comment: occasionally   Drug use: No     Allergies   Patient has no known allergies.   Review of Systems Review of Systems  Per HPI  Physical Exam Triage Vital Signs ED Triage Vitals [12/11/23 0855]  Encounter Vitals  Group     BP 104/73     Systolic BP Percentile      Diastolic BP Percentile      Pulse Rate 74     Resp 16     Temp (!) 97.4 F (36.3 C)     Temp Source Oral     SpO2 98 %     Weight      Height      Head Circumference      Peak Flow      Pain Score 0     Pain Loc      Pain Education      Exclude from Growth Chart    No data found.  Updated Vital Signs BP 104/73 (BP Location: Right Arm)   Pulse 74   Temp (!) 97.4 F (36.3 C) (Oral)   Resp 16   LMP 11/28/2023 (Exact Date)   SpO2 98%   Breastfeeding No   Visual Acuity Right Eye Distance:   Left Eye Distance:   Bilateral Distance:    Right Eye Near:   Left Eye Near:    Bilateral Near:     Physical Exam Vitals and  nursing note reviewed.  Constitutional:      General: She is awake. She is not in acute distress.    Appearance: Normal appearance. She is well-developed and well-groomed. She is not ill-appearing.  Genitourinary:    Comments: Exam deferred Neurological:     Mental Status: She is alert.  Psychiatric:        Behavior: Behavior is cooperative.      UC Treatments / Results  Labs (all labs ordered are listed, but only abnormal results are displayed) Labs Reviewed  HIV ANTIBODY (ROUTINE TESTING W REFLEX)  RPR  CERVICOVAGINAL ANCILLARY ONLY    EKG   Radiology No results found.  Procedures Procedures (including critical care time)  Medications Ordered in UC Medications - No data to display  Initial Impression / Assessment and Plan / UC Course  I have reviewed the triage vital signs and the nursing notes.  Pertinent labs & imaging results that were available during my care of the patient were reviewed by me and considered in my medical decision making (see chart for details).     Patient is well-appearing.  Vitals are stable.  GU exam deferred.  Patient perform self swab for STD.  HIV and RPR ordered.  Pregnancy testing deferred due to LMP 5/15.  Discussed follow-up and return  precautions. Final Clinical Impressions(s) / UC Diagnoses   Final diagnoses:  Screening for STD (sexually transmitted disease)     Discharge Instructions      Your results will come back over the next few days and someone will call if results are positive and require treatment.  Return here as needed.   ED Prescriptions   None    PDMP not reviewed this encounter.   Levora Reas A, NP 12/11/23 [phone]

## 2023-12-11 NOTE — Discharge Instructions (Signed)
 Your results will come back over the next few days and someone will call if results are positive and require treatment.  Return here as needed.

## 2023-12-12 LAB — CERVICOVAGINAL ANCILLARY ONLY
Chlamydia: NEGATIVE
Comment: NEGATIVE
Comment: NEGATIVE
Comment: NORMAL
Neisseria Gonorrhea: NEGATIVE
Trichomonas: NEGATIVE

## 2023-12-31 ENCOUNTER — Ambulatory Visit (HOSPITAL_COMMUNITY)
Admission: RE | Admit: 2023-12-31 | Discharge: 2023-12-31 | Disposition: A | Discharge status: Home / Self Care | Payer: Self-pay | Source: Ambulatory Visit | Attending: Family Medicine | Admitting: Family Medicine

## 2023-12-31 ENCOUNTER — Encounter (HOSPITAL_COMMUNITY): Payer: Self-pay

## 2023-12-31 VITALS — BP 106/67 | HR 71 | Temp 99.0°F | Resp 16

## 2023-12-31 DIAGNOSIS — H1013 Acute atopic conjunctivitis, bilateral: Secondary | ICD-10-CM | POA: Diagnosis not present

## 2023-12-31 MED ORDER — OLOPATADINE HCL 0.1 % OP SOLN: 1.0000 [drp] | Freq: Two times a day (BID) | OPHTHALMIC | 12 refills | Status: DC | PRN

## 2023-12-31 NOTE — ED Provider Notes (Signed)
 [facility]    CSN: [id number] Arrival date & time: 12/31/23  0827      History   Chief Complaint Chief Complaint  Patient presents with   Eye Problem    Eyes very itchy and dry .. also have a grey area in right eye - Entered by patient    HPI Rebecca Church is a 28 y.o. female.    Eye Problem Here for itchiness and dry sensation in both eyes.  Actually started first in her right eye and now she is also feeling it in her left eye.  She has been using some Systane and it has been maybe making the symptoms better.  There has been no eye pain or irritation.  The eyes have been a little red at times.  No eye discharge and no eyelid swelling.  No fever or cold symptoms.  Last menstrual cycle began yesterday.   NKDA Past Medical History:  Diagnosis Date   Gonorrhea 02/02/2019   Medical history non-contributory     There are no active problems to display for this patient.   Past Surgical History:  Procedure Laterality Date   NO PAST SURGERIES      OB History     Gravida  1   Para  1   Term  1   Preterm  0   AB  0   Living  1      SAB  0   IAB  0   Ectopic  0   Multiple  0   Live Births  1            Home Medications    Prior to Admission medications   Medication Sig Start Date End Date Taking? Authorizing Provider  olopatadine (PATANOL) 0.1 % ophthalmic solution Place 1 drop into both eyes 2 (two) times daily as needed for allergies (and eye itching). 12/31/23  Yes Rhilynn Preyer K, MD  ketoconazole (NIZORAL) 2 % shampoo Apply 1 Application topically daily as needed.    [provider]    Family History Family History  Problem Relation Age of Onset   Healthy Mother     Social History Social History   Tobacco Use   Smoking status: Never   Smokeless tobacco: Never  Vaping Use   Vaping status: Never Used  Substance Use Topics   Alcohol use: No    Comment: occasionally   Drug use: No     Allergies    Patient has no known allergies.   Review of Systems Review of Systems   Physical Exam Triage Vital Signs ED Triage Vitals  Encounter Vitals Group     BP 12/31/23 0835 106/67     Girls Systolic BP Percentile --      Girls Diastolic BP Percentile --      Boys Systolic BP Percentile --      Boys Diastolic BP Percentile --      Pulse Rate 12/31/23 0835 71     Resp 12/31/23 0835 16     Temp 12/31/23 0835 99 F (37.2 C)     Temp Source 12/31/23 0835 Oral     SpO2 12/31/23 0835 98 %     Weight --      Height --      Head Circumference --      Peak Flow --      Pain Score 12/31/23 0837 0     Pain Loc --      Pain Education --  Exclude from Growth Chart --    No data found.  Updated Vital Signs BP 106/67 (BP Location: Left Arm)   Pulse 71   Temp 99 F (37.2 C) (Oral)   Resp 16   LMP 12/30/2023 (Exact Date)   SpO2 98%   Breastfeeding No   Visual Acuity Right Eye Distance:   Left Eye Distance:   Bilateral Distance:    Right Eye Near:   Left Eye Near:    Bilateral Near:     Physical Exam Vitals reviewed.  Constitutional:      General: She is not in acute distress.    Appearance: She is not ill-appearing, toxic-appearing or diaphoretic.  HENT:     Mouth/Throat:     Mouth: Mucous membranes are moist.   Eyes:     Extraocular Movements: Extraocular movements intact.     Pupils: Pupils are equal, round, and reactive to light.     Comments: There is no injection of either eye at this time.  There is a tiny gray dot that might be a freckle in her superior sclera underneath the upper eyelid.  The lids are not swollen.  No discharge noted at this time.   Skin:    Coloration: Skin is not pale.   Neurological:     Mental Status: She is alert and oriented to person, place, and time.   Psychiatric:        Behavior: Behavior normal.      UC Treatments / Results  Labs (all labs ordered are listed, but only abnormal results are displayed) Labs Reviewed - No  data to display  EKG   Radiology No results found.  Procedures Procedures (including critical care time)  Medications Ordered in UC Medications - No data to display  Initial Impression / Assessment and Plan / UC Course  I have reviewed the triage vital signs and the nursing notes.  Pertinent labs & imaging results that were available during my care of the patient were reviewed by me and considered in my medical decision making (see chart for details).     I think this is most likely allergic conjunctivitis.  Patanol was sent in for her to use as needed.  She does have an ophthalmologist and will make follow-up appointment with him.  She can also continue to use the Systane as needed. Final Clinical Impressions(s) / UC Diagnoses   Final diagnoses:  Allergic conjunctivitis of both eyes     Discharge Instructions      Olopatadine eyedrops--1 drop in each eye 2 times daily as needed for allergies  You can also continue to use the Systane.  Please follow-up with your eye doctor.     ED Prescriptions     Medication Sig Dispense Auth. Provider   olopatadine (PATANOL) 0.1 % ophthalmic solution Place 1 drop into both eyes 2 (two) times daily as needed for allergies (and eye itching). 5 mL Ann Keto, MD      PDMP not reviewed this encounter.   Ann Keto, MD 12/31/23 [phone]

## 2023-12-31 NOTE — Discharge Instructions (Signed)
 Olopatadine eyedrops--1 drop in each eye 2 times daily as needed for allergies  You can also continue to use the Systane.  Please follow-up with your eye doctor.

## 2023-12-31 NOTE — ED Triage Notes (Signed)
 Patient here today with c/o both eye itching and dryness X 1 week. Patient also noticed a gray area in her right eye. She has used Systane with some relief. Denies visual changes.

## 2024-03-09 ENCOUNTER — Encounter (HOSPITAL_COMMUNITY): Payer: Self-pay

## 2024-03-09 ENCOUNTER — Ambulatory Visit (HOSPITAL_COMMUNITY)
Admission: RE | Admit: 2024-03-09 | Discharge: 2024-03-09 | Disposition: A | Discharge status: Home / Self Care | Source: Ambulatory Visit | Attending: Family Medicine | Admitting: Family Medicine

## 2024-03-09 VITALS — BP 96/64 | HR 92 | Temp 97.6°F | Resp 16

## 2024-03-09 DIAGNOSIS — N898 Other specified noninflammatory disorders of vagina: Secondary | ICD-10-CM | POA: Diagnosis not present

## 2024-03-09 NOTE — Discharge Instructions (Signed)
 You were seen today for vaginal symptoms.  Your vaginal swab will be resulted tomorrow, and you will be notified of any positive results.  You will also see your results on mychart.

## 2024-03-09 NOTE — ED Triage Notes (Signed)
 Patient here today with c/o vaginal discharge X 3 days. Patient states that she has a h/o BV.

## 2024-03-09 NOTE — ED Provider Notes (Signed)
 [facility]    CSN: [id number] Arrival date & time: 03/09/24  1503      History   Chief Complaint Chief Complaint  Patient presents with   Vaginal Discharge    Check for bacteria vaginosis and yeast infections and STD - Entered by patient    HPI Rebecca Church is a 28 y.o. female.    Vaginal Discharge  Patient is here to be checked for BV.  She has a h/o BV.  She has had intermittent symptoms of vaginal dryness, but no major d/c.  Slight odor?  No known STD exposure.  No other issues.        Past Medical History:  Diagnosis Date   Gonorrhea 02/02/2019   Medical history non-contributory     There are no active problems to display for this patient.   Past Surgical History:  Procedure Laterality Date   NO PAST SURGERIES      OB History     Gravida  1   Para  1   Term  1   Preterm  0   AB  0   Living  1      SAB  0   IAB  0   Ectopic  0   Multiple  0   Live Births  1            Home Medications    Prior to Admission medications   Medication Sig Start Date End Date Taking? Authorizing Provider  ketoconazole (NIZORAL) 2 % shampoo Apply 1 Application topically daily as needed.    [provider]  olopatadine  (PATANOL) 0.1 % ophthalmic solution Place 1 drop into both eyes 2 (two) times daily as needed for allergies (and eye itching). 12/31/23   Vonna Sharlet POUR, MD    Family History Family History  Problem Relation Age of Onset   Healthy Mother     Social History Social History   Tobacco Use   Smoking status: Never   Smokeless tobacco: Never  Vaping Use   Vaping status: Never Used  Substance Use Topics   Alcohol use: No    Comment: occasionally   Drug use: No     Allergies   Patient has no known allergies.   Review of Systems Review of Systems  Constitutional: Negative.   HENT: Negative.    Respiratory: Negative.    Cardiovascular: Negative.   Gastrointestinal: Negative.   Genitourinary:   Positive for vaginal discharge.  Psychiatric/Behavioral: Negative.       Physical Exam Triage Vital Signs ED Triage Vitals  Encounter Vitals Group     BP 03/09/24 1532 96/64     Girls Systolic BP Percentile --      Girls Diastolic BP Percentile --      Boys Systolic BP Percentile --      Boys Diastolic BP Percentile --      Pulse Rate 03/09/24 1532 92     Resp 03/09/24 1532 16     Temp 03/09/24 1532 97.6 F (36.4 C)     Temp Source 03/09/24 1532 Oral     SpO2 03/09/24 1532 97 %     Weight --      Height --      Head Circumference --      Peak Flow --      Pain Score 03/09/24 1531 0     Pain Loc --      Pain Education --      Exclude from [employer]  Chart --    No data found.  Updated Vital Signs BP 96/64 (BP Location: Left Arm)   Pulse 92   Temp 97.6 F (36.4 C) (Oral)   Resp 16   LMP 02/20/2024 (Exact Date)   SpO2 97%   Breastfeeding No   Visual Acuity Right Eye Distance:   Left Eye Distance:   Bilateral Distance:    Right Eye Near:   Left Eye Near:    Bilateral Near:     Physical Exam Constitutional:      General: She is not in acute distress.    Appearance: Normal appearance. She is normal weight.  Cardiovascular:     Rate and Rhythm: Normal rate and regular rhythm.  Pulmonary:     Effort: Pulmonary effort is normal.     Breath sounds: Normal breath sounds.  Neurological:     General: No focal deficit present.     Mental Status: She is alert.  Psychiatric:        Mood and Affect: Mood normal.      UC Treatments / Results  Labs (all labs ordered are listed, but only abnormal results are displayed) Labs Reviewed  CERVICOVAGINAL ANCILLARY ONLY    EKG   Radiology No results found.  Procedures Procedures (including critical care time)  Medications Ordered in UC Medications - No data to display  Initial Impression / Assessment and Plan / UC Course  I have reviewed the triage vital signs and the nursing notes.  Pertinent labs &  imaging results that were available during my care of the patient were reviewed by me and considered in my medical decision making (see chart for details).  Final Clinical Impressions(s) / UC Diagnoses   Final diagnoses:  Vaginal odor     Discharge Instructions      You were seen today for vaginal symptoms.  Your vaginal swab will be resulted tomorrow, and you will be notified of any positive results.  You will also see your results on mychart.     ED Prescriptions   None    PDMP not reviewed this encounter.   Darral Longs, MD 03/09/24 [phone]

## 2024-03-10 ENCOUNTER — Ambulatory Visit (HOSPITAL_COMMUNITY): Payer: Self-pay

## 2024-03-10 LAB — CERVICOVAGINAL ANCILLARY ONLY
Bacterial Vaginitis (gardnerella): POSITIVE — AB
Candida Glabrata: NEGATIVE
Candida Vaginitis: NEGATIVE
Chlamydia: NEGATIVE
Comment: NEGATIVE
Comment: NEGATIVE
Comment: NEGATIVE
Comment: NEGATIVE
Comment: NEGATIVE
Comment: NORMAL
Neisseria Gonorrhea: NEGATIVE
Trichomonas: NEGATIVE

## 2024-03-10 MED ORDER — METRONIDAZOLE 500 MG PO TABS: 500.0000 mg | ORAL_TABLET | Freq: Two times a day (BID) | ORAL | 0 refills | Status: DC

## 2024-04-02 ENCOUNTER — Encounter (HOSPITAL_COMMUNITY): Payer: Self-pay

## 2024-04-02 ENCOUNTER — Ambulatory Visit (HOSPITAL_COMMUNITY)
Admission: RE | Admit: 2024-04-02 | Discharge: 2024-04-02 | Disposition: A | Discharge status: Home / Self Care | Payer: Self-pay | Source: Ambulatory Visit | Attending: Emergency Medicine | Admitting: Emergency Medicine

## 2024-04-02 VITALS — BP 105/75 | HR 94 | Temp 98.1°F | Resp 17

## 2024-04-02 DIAGNOSIS — N898 Other specified noninflammatory disorders of vagina: Secondary | ICD-10-CM | POA: Insufficient documentation

## 2024-04-02 NOTE — ED Triage Notes (Signed)
 Pt reports recently treated for BV and would like to be retested bc having mild irritation. Reports some itching. Reports has intermittent vaginal discharge that is normal.

## 2024-04-02 NOTE — ED Provider Notes (Signed)
 [facility]    CSN: [id number] Arrival date & time: 04/02/24  1324      History   Chief Complaint Chief Complaint  Patient presents with   appt 130p    HPI Rebecca Church is a 28 y.o. female.   Patient presents with vaginal irritation and itching that began about 2 days ago.  Patient states that she has had intermittent vaginal discharge as well, but states that she feels like this may be normal.  Patient states that she was recently treated for bacterial vaginosis and has a history of recurring bacterial vaginosis, would like retesting for this.    Patient does report that she is sexually active.  Patient denies any known exposures to STDs.  Denies dysuria, hematuria, urinary frequency/urgency, abnormal vaginal bleeding, abdominal pain, flank pain, and fever.  The history is provided by the patient and medical records.    Past Medical History:  Diagnosis Date   Gonorrhea 02/02/2019   Medical history non-contributory     There are no active problems to display for this patient.   Past Surgical History:  Procedure Laterality Date   NO PAST SURGERIES      OB History     Gravida  1   Para  1   Term  1   Preterm  0   AB  0   Living  1      SAB  0   IAB  0   Ectopic  0   Multiple  0   Live Births  1            Home Medications    Prior to Admission medications   Medication Sig Start Date End Date Taking? Authorizing Provider  ketoconazole (NIZORAL) 2 % shampoo Apply 1 Application topically daily as needed.    [provider]  metroNIDAZOLE  (FLAGYL ) 500 MG tablet Take 1 tablet (500 mg total) by mouth 2 (two) times daily. 03/10/24   Vonna Sharlet POUR, MD  olopatadine  (PATANOL) 0.1 % ophthalmic solution Place 1 drop into both eyes 2 (two) times daily as needed for allergies (and eye itching). 12/31/23   Vonna Sharlet POUR, MD    Family History Family History  Problem Relation Age of Onset   Healthy Mother     Social  History Social History   Tobacco Use   Smoking status: Never   Smokeless tobacco: Never  Vaping Use   Vaping status: Never Used  Substance Use Topics   Alcohol use: No    Comment: occasionally   Drug use: No     Allergies   Patient has no known allergies.   Review of Systems Review of Systems  Per HPI  Physical Exam Triage Vital Signs ED Triage Vitals  Encounter Vitals Group     BP 04/02/24 1333 105/75     Girls Systolic BP Percentile --      Girls Diastolic BP Percentile --      Boys Systolic BP Percentile --      Boys Diastolic BP Percentile --      Pulse Rate 04/02/24 1333 94     Resp 04/02/24 1333 17     Temp 04/02/24 1333 98.1 F (36.7 C)     Temp Source 04/02/24 1333 Oral     SpO2 04/02/24 1333 98 %     Weight --      Height --      Head Circumference --      Peak Flow --  Pain Score 04/02/24 1332 0     Pain Loc --      Pain Education --      Exclude from Growth Chart --    No data found.  Updated Vital Signs BP 105/75 (BP Location: Left Arm)   Pulse 94   Temp 98.1 F (36.7 C) (Oral)   Resp 17   LMP 03/20/2024 (Exact Date)   SpO2 98%   Visual Acuity Right Eye Distance:   Left Eye Distance:   Bilateral Distance:    Right Eye Near:   Left Eye Near:    Bilateral Near:     Physical Exam Vitals and nursing note reviewed.  Constitutional:      General: She is awake. She is not in acute distress.    Appearance: Normal appearance. She is well-developed and well-groomed. She is not ill-appearing.  Genitourinary:    Comments: Exam deferred Neurological:     Mental Status: She is alert.  Psychiatric:        Behavior: Behavior is cooperative.      UC Treatments / Results  Labs (all labs ordered are listed, but only abnormal results are displayed) Labs Reviewed  CERVICOVAGINAL ANCILLARY ONLY    EKG   Radiology No results found.  Procedures Procedures (including critical care time)  Medications Ordered in UC Medications -  No data to display  Initial Impression / Assessment and Plan / UC Course  I have reviewed the triage vital signs and the nursing notes.  Pertinent labs & imaging results that were available during my care of the patient were reviewed by me and considered in my medical decision making (see chart for details).     Patient is overall well-appearing.  Vitals are stable.  GU exam deferred.  Patient perform self swab for STD/STI.  HIV and RPR declined.  Discussed follow-up and return precautions. Final Clinical Impressions(s) / UC Diagnoses   Final diagnoses:  Vaginal itching  Vaginal irritation     Discharge Instructions      Your results will come back over the next few days and someone will call if results are positive and require treatment.   Follow-up with your OB/GYN for recurrent vaginal concerns or return here as needed.    ED Prescriptions   None    PDMP not reviewed this encounter.   Johnie Flaming A, NP 04/02/24 1400

## 2024-04-02 NOTE — Discharge Instructions (Addendum)
 Your results will come back over the next few days and someone will call if results are positive and require treatment.   Follow-up with your OB/GYN for recurrent vaginal concerns or return here as needed.

## 2024-04-03 LAB — CERVICOVAGINAL ANCILLARY ONLY
Bacterial Vaginitis (gardnerella): NEGATIVE
Candida Glabrata: NEGATIVE
Candida Vaginitis: POSITIVE — AB
Chlamydia: NEGATIVE
Comment: NEGATIVE
Comment: NEGATIVE
Comment: NEGATIVE
Comment: NEGATIVE
Comment: NEGATIVE
Comment: NORMAL
Neisseria Gonorrhea: NEGATIVE
Trichomonas: NEGATIVE

## 2024-04-06 ENCOUNTER — Telehealth (HOSPITAL_COMMUNITY): Payer: Self-pay | Admitting: *Deleted

## 2024-04-06 ENCOUNTER — Ambulatory Visit (HOSPITAL_COMMUNITY): Payer: Self-pay

## 2024-04-06 MED ORDER — FLUCONAZOLE 150 MG PO TABS: ORAL_TABLET | ORAL | 0 refills | Status: DC

## 2024-04-06 NOTE — Telephone Encounter (Signed)
 Pt aware.

## 2024-04-06 NOTE — Telephone Encounter (Signed)
 I have sent in 2 tablets of Diflucan  for yeast infection.  She can take 1 tablet today and another tablet in 3 days if symptoms continue.

## 2024-04-06 NOTE — Telephone Encounter (Signed)
 Pt cyto positive for yeast she would like meds called in

## 2024-05-11 DIAGNOSIS — Z113 Encounter for screening for infections with a predominantly sexual mode of transmission: Secondary | ICD-10-CM | POA: Diagnosis not present

## 2024-05-11 DIAGNOSIS — Z0000 Encounter for general adult medical examination without abnormal findings: Secondary | ICD-10-CM | POA: Diagnosis not present

## 2024-05-11 DIAGNOSIS — Z130 Encounter for screening for diseases of the blood and blood-forming organs and certain disorders involving the immune mechanism: Secondary | ICD-10-CM | POA: Diagnosis not present

## 2024-05-11 DIAGNOSIS — Z13228 Encounter for screening for other metabolic disorders: Secondary | ICD-10-CM | POA: Diagnosis not present

## 2024-05-11 DIAGNOSIS — Z1329 Encounter for screening for other suspected endocrine disorder: Secondary | ICD-10-CM | POA: Diagnosis not present

## 2024-05-11 DIAGNOSIS — Z30019 Encounter for initial prescription of contraceptives, unspecified: Secondary | ICD-10-CM | POA: Diagnosis not present

## 2024-05-11 DIAGNOSIS — Z3009 Encounter for other general counseling and advice on contraception: Secondary | ICD-10-CM | POA: Diagnosis not present

## 2024-06-24 DIAGNOSIS — R7989 Other specified abnormal findings of blood chemistry: Secondary | ICD-10-CM | POA: Diagnosis not present

## 2024-07-07 ENCOUNTER — Encounter (HOSPITAL_COMMUNITY): Payer: Self-pay

## 2024-07-07 ENCOUNTER — Ambulatory Visit (HOSPITAL_COMMUNITY)
Admission: RE | Admit: 2024-07-07 | Discharge: 2024-07-07 | Disposition: A | Discharge status: Home / Self Care | Payer: Self-pay | Source: Ambulatory Visit | Attending: Family Medicine | Admitting: Family Medicine

## 2024-07-07 VITALS — BP 106/71 | HR 81 | Temp 98.8°F | Resp 16

## 2024-07-07 DIAGNOSIS — Z113 Encounter for screening for infections with a predominantly sexual mode of transmission: Secondary | ICD-10-CM | POA: Diagnosis present

## 2024-07-07 MED ORDER — METRONIDAZOLE 500 MG PO TABS: 500.0000 mg | ORAL_TABLET | Freq: Two times a day (BID) | ORAL | 0 refills | Status: DC

## 2024-07-07 NOTE — ED Provider Notes (Signed)
 " [facility]   [id number] 07/07/24 Arrival Time: 1301  ASSESSMENT & PLAN:  1. Screening for STDs (sexually transmitted diseases)    H/O BV. Meds ordered this encounter  Medications   metroNIDAZOLE  (FLAGYL ) 500 MG tablet    Sig: Take 1 tablet (500 mg total) by mouth 2 (two) times daily.    Dispense:  14 tablet    Refill:  0      Discharge Instructions      We have sent testing for sexually transmitted infections. We will notify you of any positive results once they are received. If required, we will prescribe any medications you might need.  Please refrain from all sexual activity for at least the next seven days.     Without s/s of PID.  Labs Reviewed  CERVICOVAGINAL ANCILLARY ONLY     Will notify of any positive results.   Reviewed expectations re: course of current medical issues. Questions answered. Outlined signs and symptoms indicating need for more acute intervention. Patient verbalized understanding. After Visit Summary given.   SUBJECTIVE:  Rebecca Church is a 28 y.o. female who requests STI testing to include BV and yeast. Ques scant vaginal d/c. Denies fever/abd/pelvic pain.  Patient's last menstrual period was 06/25/2024 (exact date).   OBJECTIVE:  Vitals:   07/07/24 1317  BP: 106/71  Pulse: 81  Resp: 16  Temp: 98.8 F (37.1 C)  TempSrc: Oral  SpO2: 98%     General appearance: alert, cooperative, appears stated age and no distress GU: deferred Skin: warm and dry Psychological: alert and cooperative; normal mood and affect.    Labs Reviewed  CERVICOVAGINAL ANCILLARY ONLY    Allergies[1]  Past Medical History:  Diagnosis Date   Gonorrhea 02/02/2019   Medical history non-contributory    Family History  Problem Relation Age of Onset   Healthy Mother    Social History   Socioeconomic History   Marital status: Single    Spouse name: Not on file   Number of children: Not on file   Years of education: Not on  file   Highest education level: Some college, no degree  Occupational History   Not on file  Tobacco Use   Smoking status: Never   Smokeless tobacco: Never  Vaping Use   Vaping status: Never Used  Substance and Sexual Activity   Alcohol use: No    Comment: occasionally   Drug use: No   Sexual activity: Yes    Birth control/protection: None  Other Topics Concern   Not on file  Social History Narrative   Not on file   Social Drivers of Health   Tobacco Use: Low Risk (07/07/2024)   Patient History    Smoking Tobacco Use: Never    Smokeless Tobacco Use: Never    Passive Exposure: Not on file  Financial Resource Strain: Low Risk (11/09/2022)   Overall Financial Resource Strain (CARDIA)    Difficulty of Paying Living Expenses: Not hard at all  Food Insecurity: Not at Risk (05/11/2024)   Received from [facility] Insecurity    Within the past 12 months, the food you bought just didn't last and you didn't have enough money to get more.: 1  Transportation Needs: Not at Risk (05/11/2024)   Received from [employer] Needs    In the past 12 months, has lack of transportation kept you from medical appointments, meetings, work or from getting things needed for daily living? (Check all that apply): 1  Physical Activity: Insufficiently Active (11/09/2022)   Exercise Vital Sign    Days of Exercise per Week: 2 days    Minutes of Exercise per Session: 40 min  Stress: No Stress Concern Present (11/09/2022)   [employer] - Occupational Stress Questionnaire    Feeling of Stress : Not at all  Social Connections: Not on File (03/21/2023)   Received from [facility]   Social Connections    Connectedness: 0  Intimate Partner Violence: Not At Risk (12/08/2022)   Humiliation, Afraid, Rape, and Kick questionnaire    Fear of Current or Ex-Partner: No    Emotionally Abused: No    Physically Abused: No    Sexually Abused: No  Depression (PHQ2-9): Low Risk  (11/30/2022)   Depression (PHQ2-9)    PHQ-2 Score: 0  Alcohol Screen: Low Risk (11/09/2022)   Alcohol Screen    Last Alcohol Screening Score (AUDIT): 2  Housing: Low Risk (12/08/2022)   Housing    Last Housing Risk Score: 0  Utilities: Not At Risk (12/08/2022)   [facility] Utilities    Threatened with loss of utilities: No  Health Literacy: Not on file            [1] No Known Allergies    Rolinda Rogue, MD 07/07/24 1358  "

## 2024-07-07 NOTE — ED Triage Notes (Signed)
 Patient here today to be tested for Stds. Patient states that she has a h/o BV. Denies symptoms currently.

## 2024-07-07 NOTE — Discharge Instructions (Signed)
 We have sent testing for sexually transmitted infections. We will notify you of any positive results once they are received. If required, we will prescribe any medications you might need.  Please refrain from all sexual activity for at least the next seven days.

## 2024-07-08 ENCOUNTER — Ambulatory Visit (HOSPITAL_COMMUNITY): Payer: Self-pay

## 2024-07-08 LAB — CERVICOVAGINAL ANCILLARY ONLY
Bacterial Vaginitis (gardnerella): NEGATIVE
Candida Glabrata: NEGATIVE
Candida Vaginitis: NEGATIVE
Chlamydia: POSITIVE — AB
Comment: NEGATIVE
Comment: NEGATIVE
Comment: NEGATIVE
Comment: NEGATIVE
Comment: NEGATIVE
Comment: NORMAL
Neisseria Gonorrhea: NEGATIVE
Trichomonas: POSITIVE — AB

## 2024-07-08 MED ORDER — DOXYCYCLINE HYCLATE 100 MG PO TABS: 100.0000 mg | ORAL_TABLET | Freq: Two times a day (BID) | ORAL | 0 refills | Status: AC

## 2024-07-23 ENCOUNTER — Encounter (HOSPITAL_COMMUNITY): Payer: Self-pay

## 2024-07-23 ENCOUNTER — Ambulatory Visit (HOSPITAL_COMMUNITY)
Admission: EM | Admit: 2024-07-23 | Discharge: 2024-07-23 | Disposition: A | Discharge status: Home / Self Care | Attending: Family Medicine | Admitting: Family Medicine

## 2024-07-23 DIAGNOSIS — N898 Other specified noninflammatory disorders of vagina: Secondary | ICD-10-CM | POA: Diagnosis not present

## 2024-07-23 LAB — HIV ANTIBODY (ROUTINE TESTING W REFLEX): HIV Screen 4th Generation wRfx: NONREACTIVE

## 2024-07-23 NOTE — ED Triage Notes (Addendum)
 Patient reports that she is wanting STD testing and a pregnancy test. Patient states she was recently treated for STDs, but is having a clear vaginal discharge, lower abdominal cramping, and is 5 days late for her period. Patient says she had a negative home pregnancy test this AM

## 2024-07-23 NOTE — Discharge Instructions (Addendum)
 You were swabbed today for sexually transmitted infections. Your vaginal swab and blood tests results will take 24-48 hrs for results to come back. You will be called with your results if they are positive and what the plan is if needing treatment.

## 2024-07-23 NOTE — ED Provider Notes (Addendum)
 " [facility]    CSN: [id number] Arrival date & time: 07/23/24  0804      History   Chief Complaint Chief Complaint  Patient presents with   STD testing    HPI Rebecca Church is a 29 y.o. female.   - treated for chlamydia and trichomas 24th of December - took Flagyl  and doxycycline , finished those on the 30th - was having discharge at that time - got better with being treated - started having discharge couple days after taking antibiotic - clear discharge - no itchiness - no dysuria or frequency - some abdominal pain in lower stomach area - last bowel movement was last night was softer than normal - was not sexually active since before being treated - has not been on any form of birth control the past couple months - cycles are generally regular - last cycle December 11th-15th  The history is provided by the patient.    Past Medical History:  Diagnosis Date   Gonorrhea 02/02/2019   Medical history non-contributory     There are no active problems to display for this patient.   Past Surgical History:  Procedure Laterality Date   NO PAST SURGERIES      OB History     Gravida  1   Para  1   Term  1   Preterm  0   AB  0   Living  1      SAB  0   IAB  0   Ectopic  0   Multiple  0   Live Births  1            Home Medications    Prior to Admission medications  Medication Sig Start Date End Date Taking? Authorizing Provider  ketoconazole (NIZORAL) 2 % shampoo Apply 1 Application topically daily as needed.    [provider]  metroNIDAZOLE  (FLAGYL ) 500 MG tablet Take 1 tablet (500 mg total) by mouth 2 (two) times daily. Patient not taking: Reported on 07/23/2024 07/07/24   Rolinda Rogue, MD  olopatadine  (PATANOL) 0.1 % ophthalmic solution Place 1 drop into both eyes 2 (two) times daily as needed for allergies (and eye itching). Patient not taking: Reported on 07/23/2024 12/31/23   Vonna Sharlet POUR, MD    Family  History Family History  Problem Relation Age of Onset   Healthy Mother     Social History Social History[1]   Allergies   Patient has no known allergies.   Review of Systems Review of Systems  Constitutional:  Negative for chills and fever.     Physical Exam Triage Vital Signs ED Triage Vitals [07/23/24 0820]  Encounter Vitals Group     BP 111/74     Girls Systolic BP Percentile      Girls Diastolic BP Percentile      Boys Systolic BP Percentile      Boys Diastolic BP Percentile      Pulse Rate 81     Resp 16     Temp 98.6 F (37 C)     Temp Source Oral     SpO2 98 %     Weight      Height      Head Circumference      Peak Flow      Pain Score 4     Pain Loc      Pain Education      Exclude from Growth Chart    No data found.  Updated Vital Signs BP 111/74 (BP Location: Right Arm)   Pulse 81   Temp 98.6 F (37 C) (Oral)   Resp 16   LMP 06/25/2024 (Exact Date)   SpO2 98%   Visual Acuity Right Eye Distance:   Left Eye Distance:   Bilateral Distance:    Right Eye Near:   Left Eye Near:    Bilateral Near:     Physical Exam Vitals and nursing note reviewed.  Constitutional:      General: She is not in acute distress.    Appearance: She is well-developed.  HENT:     Head: Normocephalic and atraumatic.  Eyes:     Conjunctiva/sclera: Conjunctivae normal.  Cardiovascular:     Rate and Rhythm: Normal rate and regular rhythm.     Heart sounds: No murmur heard. Pulmonary:     Effort: Pulmonary effort is normal. No respiratory distress.     Breath sounds: Normal breath sounds.  Abdominal:     Palpations: Abdomen is soft.     Tenderness: There is no abdominal tenderness. There is no guarding or rebound.  Musculoskeletal:        General: No swelling.     Cervical back: Neck supple.  Skin:    General: Skin is warm and dry.     Capillary Refill: Capillary refill takes less than 2 seconds.  Neurological:     Mental Status: She is alert.   Psychiatric:        Mood and Affect: Mood normal.      UC Treatments / Results  Labs (all labs ordered are listed, but only abnormal results are displayed) Labs Reviewed  SYPHILIS: RPR W/REFLEX TO RPR TITER AND TREPONEMAL ANTIBODIES, TRADITIONAL SCREENING AND DIAGNOSIS ALGORITHM  HIV ANTIBODY (ROUTINE TESTING W REFLEX)  CERVICOVAGINAL ANCILLARY ONLY    EKG   Radiology No results found.  Procedures Procedures (including critical care time)  Medications Ordered in UC Medications - No data to display  Initial Impression / Assessment and Plan / UC Course  I have reviewed the triage vital signs and the nursing notes.  Pertinent labs & imaging results that were available during my care of the patient were reviewed by me and considered in my medical decision making (see chart for details).     Patient presenting with clear vaginal discharge roughly 1 week after completing antibiotic course to treat trichomonas and chlamydia.  Suspect that this may represent new BV or yeast infection as she has not been sexually active since before being treated.  Repeat testing for chlamydia and trichomonas may still be positive, would not treat again if positive.  Discussed additional testing for syphilis and HIV which patient agreed to. Suspect abdominal cramping is secondary to start of menstrual cycle. Doubt PID.  Cervicovaginal ancillary testing pending, HIV and RPR testing pending. Will treat as indicated.  Final Clinical Impressions(s) / UC Diagnoses   Final diagnoses:  Vaginal discharge     Discharge Instructions      You were swabbed today for sexually transmitted infections. Your vaginal swab and blood tests results will take 24-48 for results to come back. You will be called with your results if they positive and what the plan is if needing treatment.     ED Prescriptions   None    PDMP not reviewed this encounter.    Alba Sharper, MD 07/23/24 [id number]     Alba Sharper, MD 07/23/24 [phone]     [1]  Social History Tobacco Use  Smoking status: Never   Smokeless tobacco: Never  Vaping Use   Vaping status: Never Used  Substance Use Topics   Alcohol use: No    Comment: occasionally   Drug use: No     Alba Sharper, MD 07/23/24 [phone]  "

## 2024-07-24 LAB — CERVICOVAGINAL ANCILLARY ONLY
Bacterial Vaginitis (gardnerella): NEGATIVE
Candida Glabrata: NEGATIVE
Candida Vaginitis: NEGATIVE
Chlamydia: NEGATIVE
Comment: NEGATIVE
Comment: NEGATIVE
Comment: NEGATIVE
Comment: NEGATIVE
Comment: NEGATIVE
Comment: NORMAL
Neisseria Gonorrhea: NEGATIVE
Trichomonas: NEGATIVE

## 2024-07-24 LAB — SYPHILIS: RPR W/REFLEX TO RPR TITER AND TREPONEMAL ANTIBODIES, TRADITIONAL SCREENING AND DIAGNOSIS ALGORITHM: RPR Ser Ql: NONREACTIVE

## 2024-07-29 ENCOUNTER — Ambulatory Visit (HOSPITAL_COMMUNITY): Payer: Self-pay
